# Patient Record
Sex: Female | Born: 1970 | Race: Black or African American | Hispanic: No | Smoking: Never smoker
Health system: Southern US, Community
[De-identification: ages and names within clinical notes are randomized; demographics above are authoritative.]

## PROBLEM LIST (undated history)

## (undated) DIAGNOSIS — H539 Unspecified visual disturbance: Secondary | ICD-10-CM

## (undated) HISTORY — DX: Unspecified visual disturbance: H53.9

## (undated) HISTORY — PX: CRANIECTOMY: SHX331

## (undated) HISTORY — PX: OTHER SURGICAL HISTORY: SHX169

---

## 1998-03-05 ENCOUNTER — Emergency Department (HOSPITAL_COMMUNITY): Admission: EM | Admit: 1998-03-05 | Discharge: 1998-03-05 | Payer: Self-pay | Admitting: Emergency Medicine

## 1998-05-05 ENCOUNTER — Other Ambulatory Visit: Admission: RE | Admit: 1998-05-05 | Discharge: 1998-05-05 | Payer: Self-pay | Admitting: Obstetrics

## 1998-05-08 ENCOUNTER — Other Ambulatory Visit: Admission: RE | Admit: 1998-05-08 | Discharge: 1998-05-08 | Payer: Self-pay | Admitting: Obstetrics

## 2002-11-17 ENCOUNTER — Encounter: Admission: RE | Admit: 2002-11-17 | Discharge: 2002-11-17 | Payer: Self-pay | Admitting: Family Medicine

## 2003-12-06 ENCOUNTER — Encounter: Admission: RE | Admit: 2003-12-06 | Discharge: 2003-12-06 | Payer: Self-pay | Admitting: Family Medicine

## 2003-12-06 ENCOUNTER — Encounter (INDEPENDENT_AMBULATORY_CARE_PROVIDER_SITE_OTHER): Payer: Self-pay | Admitting: *Deleted

## 2004-11-05 ENCOUNTER — Ambulatory Visit: Payer: Self-pay | Admitting: Family Medicine

## 2005-06-25 ENCOUNTER — Ambulatory Visit: Payer: Self-pay | Admitting: Sports Medicine

## 2005-10-16 ENCOUNTER — Ambulatory Visit: Payer: Self-pay | Admitting: Family Medicine

## 2005-10-16 ENCOUNTER — Ambulatory Visit (HOSPITAL_COMMUNITY): Admission: RE | Admit: 2005-10-16 | Discharge: 2005-10-16 | Payer: Self-pay | Admitting: Sports Medicine

## 2006-08-27 ENCOUNTER — Encounter (INDEPENDENT_AMBULATORY_CARE_PROVIDER_SITE_OTHER): Payer: Self-pay | Admitting: *Deleted

## 2006-09-15 ENCOUNTER — Ambulatory Visit: Payer: Self-pay | Admitting: Family Medicine

## 2006-12-18 DIAGNOSIS — R8789 Other abnormal findings in specimens from female genital organs: Secondary | ICD-10-CM | POA: Insufficient documentation

## 2006-12-18 DIAGNOSIS — F411 Generalized anxiety disorder: Secondary | ICD-10-CM | POA: Insufficient documentation

## 2006-12-19 ENCOUNTER — Encounter (INDEPENDENT_AMBULATORY_CARE_PROVIDER_SITE_OTHER): Payer: Self-pay | Admitting: *Deleted

## 2007-04-20 ENCOUNTER — Encounter: Payer: Self-pay | Admitting: *Deleted

## 2007-09-21 ENCOUNTER — Ambulatory Visit: Payer: Self-pay | Admitting: Family Medicine

## 2007-09-21 ENCOUNTER — Encounter: Payer: Self-pay | Admitting: Family Medicine

## 2007-09-21 LAB — CONVERTED CEMR LAB: Pap Smear: NORMAL

## 2007-12-14 ENCOUNTER — Emergency Department (HOSPITAL_COMMUNITY): Admission: EM | Admit: 2007-12-14 | Discharge: 2007-12-14 | Payer: Self-pay | Admitting: Emergency Medicine

## 2007-12-28 ENCOUNTER — Ambulatory Visit: Payer: Self-pay | Admitting: Family Medicine

## 2007-12-29 ENCOUNTER — Encounter: Payer: Self-pay | Admitting: *Deleted

## 2007-12-29 ENCOUNTER — Telehealth: Payer: Self-pay | Admitting: *Deleted

## 2008-04-25 ENCOUNTER — Telehealth: Payer: Self-pay | Admitting: Psychology

## 2008-07-25 ENCOUNTER — Ambulatory Visit: Payer: Self-pay | Admitting: Family Medicine

## 2008-12-09 ENCOUNTER — Encounter: Payer: Self-pay | Admitting: Family Medicine

## 2008-12-09 ENCOUNTER — Ambulatory Visit: Payer: Self-pay | Admitting: Family Medicine

## 2008-12-13 ENCOUNTER — Encounter: Payer: Self-pay | Admitting: Family Medicine

## 2009-04-25 ENCOUNTER — Telehealth: Payer: Self-pay | Admitting: Family Medicine

## 2009-04-28 ENCOUNTER — Encounter: Payer: Self-pay | Admitting: Family Medicine

## 2009-04-28 ENCOUNTER — Ambulatory Visit: Payer: Self-pay | Admitting: Family Medicine

## 2009-05-08 ENCOUNTER — Encounter: Payer: Self-pay | Admitting: Family Medicine

## 2009-05-22 ENCOUNTER — Telehealth: Payer: Self-pay | Admitting: Family Medicine

## 2009-06-23 ENCOUNTER — Ambulatory Visit: Payer: Self-pay | Admitting: Family Medicine

## 2009-06-23 ENCOUNTER — Encounter: Payer: Self-pay | Admitting: Family Medicine

## 2009-06-23 LAB — CONVERTED CEMR LAB
HDL: 57 mg/dL (ref >39)
Total CHOL/HDL Ratio: 3.2
Triglycerides: 47 mg/dL (ref <150)

## 2009-06-27 ENCOUNTER — Encounter: Payer: Self-pay | Admitting: Family Medicine

## 2010-08-30 ENCOUNTER — Encounter: Payer: Self-pay | Admitting: Family Medicine

## 2010-11-20 NOTE — Letter (Signed)
Summary: Out of Work  [facility]  [address]   [place], [place] [address]   Phone: [phone]  Fax: [phone]    December 29, 2007   Employee:  [employer]    To Whom It May Concern:   For Medical reasons, please excuse the above named employee from work for the following dates:  Start:   12-28-07  End:   12-29-07  If you need additional information, please feel free to contact our office.         Sincerely,    Cormick Moss CMA,

## 2010-11-20 NOTE — Miscellaneous (Signed)
  Clinical Lists Changes  Problems: Removed problem of SCREENING OTHER&UNSPEC CARDIOVASCULAR CONDITIONS (ICD-V81.2) Removed problem of DEPRESSION, SITUATIONAL, ACUTE (ICD-300.4) Removed problem of CONTACT OR EXPOSURE TO OTHER VIRAL DISEASES (ICD-V01.79) Removed problem of DISTURBANCE OF SKIN SENSATION (ICD-782.0) Removed problem of WEIGHT GAIN (ICD-783.1) Removed problem of DIZZINESS (ICD-780.4) Removed problem of SCREENING FOR LIPOID DISORDERS (ICD-V77.91) Removed problem of SCREENING FOR DIABETES MELLITUS (ICD-V77.1) Removed problem of ROUTINE GYNECOLOGICAL EXAMINATION (ICD-V72.31)

## 2010-11-20 NOTE — Progress Notes (Signed)
Summary: pls call  Phone Note Call from Patient Call back at Work Phone [phone] Call back at [phone]   Caller: Patient Summary of Call: pt is wanting to talk to [facility] about her FLMA - has wrong dates on the form. pls call  Initial call taken by: De Nurse,  May 22, 2009 2:20 PM  Follow-up for Phone Call        Left message that I need her to bring by form with clearly marked dates.  I saw her for the problem on 7/9 which is date I indicated for leave to begin and set for 30 days.   Follow-up by: Luretha Murphy NP,  May 22, 2009 3:36 PM

## 2010-11-20 NOTE — Assessment & Plan Note (Signed)
Summary: f/u possible panic attack at work/el  Medications Added NORTREL 7/7/7 0.5/0.75/1-35 MG-MCG TABS (NORETHIN-ETH ESTRAD TRIPHASIC) Take 1 tablet by mouth once a day ZOLOFT 50 MG  TABS (SERTRALINE HCL) 1/2 for daily for  1 week and then one daily.        Vital Signs:  Patient Profile:   40 Years Old Female Weight:      159 pounds Pulse rate:   65 / minute BP sitting:   106 / 69  (right arm)  Pt. in pain?   no  Vitals Entered By: Renato Battles slade,cma              Is Patient Diabetic? No     Chief Complaint:  ?anxiety. has been on zoloft years ago. dizziness and hyperventilating.Marland Kitchen  History of Present Illness: Pt to clinic s/p near syncopal episode last Monday.  Pt states she got up from her chair at work and made it to the mail room when she felt lightheaded and went down to the floor.  Reports she could hear everything around her but was unable to talk at the time-was transported to the ED and everything was found to be 'normal'.  States she has episodes of lightheadedness almost daily-room does not spin, just lightheaded.  Previous history of treatment with zoloft for anxiety.  States she did well with the medication years ago but stopped taking the med.  She reports in the past year a new marriage, moving, new job, and  small issues with teenage daughters.  Reports that she does worry about her daughters and has a good support system in her husband.  She is expresses concern about taking medication because she would like to be pregnant within the year.       Social History:    Married x  months.- currently having difficulty; Two daughters (41 and 73) Enjoys exercise; Support from church and husband.     Risk Factors:  Tobacco use:  never Exercise:  yes    Times per week:  3    Type:  treadmill   Review of Systems  CV      Denies chest pain or discomfort, palpitations, shortness of breath with exertion, swelling of feet, and swelling of hands.  Neuro      Denies  disturbances in coordination, falling down, headaches, sensation of room spinning, visual disturbances, and weakness.  Psych      See HPI     Impression & Recommendations:  Problem # 1:  DIZZINESS (ICD-780.4)  With previous work-up in ED (Negative labs and EKG) question underlying anxiety issues.  Patient had successful resolution of this type in the past with Zoloft.  Mother with anxiety disorder.  Missing meals and worried about weight.  Refer to Dr. Gerilyn Pilgrim for basic education. Orders: [facility]- Est Level  3 ([address])   Problem # 2:  ANXIETY (ICD-300.00) Anxiety.  Multiple life changes within the past year (positive).   Will try low dose zoloft and reevaluate in two weeks.  Pt to call clinic and report symptoms.   Her updated medication list for this problem includes:    Zoloft 50 Mg Tabs (Sertraline hcl) .Marland Kitchen... 1/2 for daily for  1 week and then one daily.   Complete Medication List: 1)  Nortrel 7/7/7 0.5/0.75/1-35 Mg-mcg Tabs (Norethin-eth estrad triphasic) .... Take 1 tablet by mouth once a day 2)  Zoloft 50 Mg Tabs (Sertraline hcl) .... 1/2 for daily for  1 week and then one daily.  Other  Orders: Nutrition Referral (Nutrition)   Patient Instructions: 1)  Eat regular meals.    Prescriptions: ZOLOFT 50 MG  TABS (SERTRALINE HCL) 1/2 for daily for  1 week and then one daily.  #30 x 6   Entered and Authorized by:   Luretha Murphy NP   Signed by:   Luretha Murphy NP on 12/28/2007   Method used:   Electronically sent to ...       [facility] [phone]*       [address]       [place], [place]  [address]       Ph: [phone]       Fax: [phone]   RxID:   [id number]  ]

## 2010-11-28 ENCOUNTER — Encounter: Payer: Self-pay | Admitting: *Deleted

## 2011-01-11 ENCOUNTER — Ambulatory Visit (INDEPENDENT_AMBULATORY_CARE_PROVIDER_SITE_OTHER): Payer: 59 | Admitting: Family Medicine

## 2011-01-11 ENCOUNTER — Other Ambulatory Visit (HOSPITAL_COMMUNITY)
Admission: RE | Admit: 2011-01-11 | Discharge: 2011-01-11 | Disposition: A | Discharge status: Home / Self Care | Payer: 59 | Source: Ambulatory Visit | Attending: Family Medicine | Admitting: Family Medicine

## 2011-01-11 ENCOUNTER — Encounter: Payer: Self-pay | Admitting: Family Medicine

## 2011-01-11 VITALS — BP 108/60 | HR 72 | Ht 64.0 in | Wt 158.0 lb

## 2011-01-11 DIAGNOSIS — Z01419 Encounter for gynecological examination (general) (routine) without abnormal findings: Secondary | ICD-10-CM | POA: Insufficient documentation

## 2011-01-11 DIAGNOSIS — Z131 Encounter for screening for diabetes mellitus: Secondary | ICD-10-CM

## 2011-01-11 DIAGNOSIS — Z13220 Encounter for screening for lipoid disorders: Secondary | ICD-10-CM

## 2011-01-11 DIAGNOSIS — Z124 Encounter for screening for malignant neoplasm of cervix: Secondary | ICD-10-CM

## 2011-01-11 LAB — LIPID PANEL
Cholesterol: 199 mg/dL (ref 0–200)
HDL: 67 mg/dL (ref >39)
LDL Cholesterol: 122 mg/dL — ABNORMAL HIGH (ref 0–99)
Total CHOL/HDL Ratio: 3 Ratio
Triglycerides: 49 mg/dL (ref <150)
VLDL: 10 mg/dL (ref 0–40)

## 2011-01-11 LAB — GLUCOSE, RANDOM: Glucose, Bld: 80 mg/dL (ref 70–99)

## 2011-01-11 NOTE — Progress Notes (Signed)
  Subjective:     JAIAH WEIGEL is a 40 y.o. female and is here for a comprehensive physical exam. The patient reports problems - occasional episodes of sweating, weakness and blurry vision, usually after not eating for a while.   Health Maintenance  Topic Date Due  . Pap Smear  01/01/1988  . Tetanus/tdap  01/20/2012      Review of Systems A comprehensive review of systems was negative except for: Constitutional: positive for sweats   Objective:    General appearance: cooperative and appears older than stated age Ears: normal TM's and external ear canals both ears Nose: Nares normal. Septum midline. Mucosa normal. No drainage or sinus tenderness. Throat: lips, mucosa, and tongue normal; teeth and gums normal Neck: no adenopathy, no carotid bruit, no JVD, supple, symmetrical, trachea midline and thyroid not enlarged, symmetric, no tenderness/mass/nodules Lungs: clear to auscultation bilaterally Breasts: normal appearance, no masses or tenderness Heart: regular rate and rhythm, S1, S2 normal, no murmur, click, rub or gallop Abdomen: soft, non-tender; bowel sounds normal; no masses,  no organomegaly Pelvic: cervix normal in appearance, external genitalia normal, no adnexal masses or tenderness, no cervical motion tenderness, rectovaginal septum normal, uterus normal size, shape, and consistency and vagina normal without discharge Extremities: extremities normal, atraumatic, no cyanosis or edema Pulses: 2+ and symmetric Skin: Skin color, texture, turgor normal. No rashes or lesions Lymph nodes: Cervical, supraclavicular, and axillary nodes normal.    Assessment:    Healthy female exam. With no active problems     Plan:     See After Visit Summary for Counseling Recommendations

## 2011-01-11 NOTE — Patient Instructions (Addendum)
  Place annual gynecologic exam patient instructions here. You will likely only need a PAP every 3 years if this is normal. Please call your insurance to find out when they pay for the first mammogram We will do blood work to check for diabetes and cholesterol For constipation I recommend generic Miralax (polyethaline glycol)

## 2011-01-14 ENCOUNTER — Encounter: Payer: Self-pay | Admitting: Family Medicine

## 2011-01-15 ENCOUNTER — Encounter: Payer: Self-pay | Admitting: Family Medicine

## 2011-07-15 LAB — CBC
MCHC: 33.4
Platelets: 337
RDW: 13.4

## 2011-07-15 LAB — URINE MICROSCOPIC-ADD ON

## 2011-07-15 LAB — I-STAT 8, (EC8 V) (CONVERTED LAB)
Acid-base deficit: 1
Bicarbonate: 25.2 — ABNORMAL HIGH
HCT: 42
Operator id: 146091
pCO2, Ven: 46.1
pH, Ven: 7.345 — ABNORMAL HIGH

## 2011-07-15 LAB — POCT CARDIAC MARKERS: Troponin i, poc: <0.05

## 2011-07-15 LAB — POCT PREGNANCY, URINE
Operator id: 146091
Preg Test, Ur: NEGATIVE

## 2011-07-15 LAB — POCT I-STAT CREATININE
Creatinine, Ser: 1
Operator id: 146091

## 2011-07-15 LAB — URINALYSIS, ROUTINE W REFLEX MICROSCOPIC
Glucose, UA: NEGATIVE
Ketones, ur: NEGATIVE
Leukocytes, UA: NEGATIVE
Nitrite: NEGATIVE
Protein, ur: NEGATIVE
pH: 7.5

## 2011-10-09 ENCOUNTER — Emergency Department (INDEPENDENT_AMBULATORY_CARE_PROVIDER_SITE_OTHER)
Admission: EM | Admit: 2011-10-09 | Discharge: 2011-10-09 | Disposition: A | Discharge status: Home / Self Care | Payer: Self-pay | Source: Home / Self Care | Attending: Family Medicine | Admitting: Family Medicine

## 2011-10-09 ENCOUNTER — Encounter (HOSPITAL_COMMUNITY): Payer: Self-pay | Admitting: Emergency Medicine

## 2011-10-09 DIAGNOSIS — R05 Cough: Secondary | ICD-10-CM

## 2011-10-09 MED ORDER — GUAIFENESIN-CODEINE 100-10 MG/5ML PO SYRP: 5.0000 mL | ORAL_SOLUTION | Freq: Four times a day (QID) | ORAL | Status: AC | PRN

## 2011-10-09 NOTE — ED Provider Notes (Signed)
History     CSN: [id number] Arrival date & time: 10/09/2011  7:28 PM   First MD Initiated Contact with Patient 10/09/11 1838      Chief Complaint  Patient presents with  . Sinusitis    (Consider location/radiation/quality/duration/timing/severity/associated sxs/prior treatment) HPI Comments: Joy Gentry presents for evaluation of persistent non-productive cough since Thanksgiving. She reports having a URI at that time that has since resolved with a residual dry cough. She denies any fever or other symptoms at this time.   Patient is a 40 y.o. female presenting with sinusitis. The history is provided by the patient.  Sinusitis  This is a recurrent problem. The current episode started more than 1 week ago. The problem has not changed since onset.There has been no fever. Associated symptoms include congestion and cough. Pertinent negatives include no chills, no ear pain, no sinus pressure, no sore throat and no shortness of breath. She has tried other medications for the symptoms. The treatment provided no relief.    History reviewed. No pertinent past medical history.  History reviewed. No pertinent past surgical history.  History reviewed. No pertinent family history.  History  Substance Use Topics  . Smoking status: Never Smoker   . Smokeless tobacco: Not on file  . Alcohol Use: Not on file    OB History    Grav Para Term Preterm Abortions TAB SAB Ect Mult Living                  Review of Systems  Constitutional: Negative for fever and chills.  HENT: Positive for congestion. Negative for ear pain, sore throat, rhinorrhea, sneezing, trouble swallowing and sinus pressure.   Eyes: Negative.   Respiratory: Positive for cough. Negative for shortness of breath and wheezing.   Cardiovascular: Negative.   Gastrointestinal: Negative.   Genitourinary: Negative.   Musculoskeletal: Negative.   Skin: Negative.   Neurological: Negative.     Allergies  Review of patient's  allergies indicates no known allergies.  Home Medications   Current Outpatient Rx  Name Route Sig Dispense Refill  . GUAIFENESIN-CODEINE 100-10 MG/5ML PO SYRP Oral Take 5 mLs by mouth every 6 (six) hours as needed for cough or congestion. 120 mL 0    BP 106/69  Pulse 60  Temp(Src) 98.6 F (37 C) (Oral)  Resp 15  SpO2 100%  LMP 10/06/2011  Physical Exam  Nursing note and vitals reviewed. Constitutional: She is oriented to person, place, and time. She appears well-developed and well-nourished.  HENT:  Head: Normocephalic and atraumatic.  Eyes: EOM are normal.  Neck: Normal range of motion.  Cardiovascular: Normal rate and regular rhythm.   Pulmonary/Chest: Effort normal and breath sounds normal. She has no wheezes.  Musculoskeletal: Normal range of motion.  Neurological: She is alert and oriented to person, place, and time.  Skin: Skin is warm and dry.  Psychiatric: Her behavior is normal.    ED Course  Procedures (including critical care time)  Labs Reviewed - No data to display No results found.   1. Post-viral cough syndrome        Rx given for guaifenesin AC.        Richardo Priest, MD 10/09/11 2020

## 2011-10-09 NOTE — ED Notes (Signed)
Pt been sick since Thanksgiving, took OTC medicines, got better but not completely and is getting worse again. Cough, congestion, aches a week ago. Biggest complaint is the cough.

## 2012-12-01 ENCOUNTER — Ambulatory Visit (INDEPENDENT_AMBULATORY_CARE_PROVIDER_SITE_OTHER): Payer: BC Managed Care – PPO | Admitting: Family Medicine

## 2012-12-01 ENCOUNTER — Other Ambulatory Visit: Payer: Self-pay | Admitting: Family Medicine

## 2012-12-01 ENCOUNTER — Encounter: Payer: Self-pay | Admitting: Family Medicine

## 2012-12-01 VITALS — BP 118/71 | HR 80 | Ht 64.0 in | Wt 154.0 lb

## 2012-12-01 DIAGNOSIS — Z0000 Encounter for general adult medical examination without abnormal findings: Secondary | ICD-10-CM

## 2012-12-01 DIAGNOSIS — Z01419 Encounter for gynecological examination (general) (routine) without abnormal findings: Secondary | ICD-10-CM

## 2012-12-01 DIAGNOSIS — Z1231 Encounter for screening mammogram for malignant neoplasm of breast: Secondary | ICD-10-CM

## 2012-12-01 DIAGNOSIS — Z23 Encounter for immunization: Secondary | ICD-10-CM

## 2012-12-01 NOTE — Patient Instructions (Signed)
It was nice to meet you.  Your blood pressure today was BP: 118/71 mmHg, which is perfect.   You can read about using ovulation strips online if you are intrested in using that to help you get pregnant.   Please continue to get regular exercise, start taking a prenatal vitamin, and continue to avoid tobacco, alcohol and drugs.  Joy Gentry!

## 2012-12-02 ENCOUNTER — Encounter: Payer: Self-pay | Admitting: Family Medicine

## 2012-12-02 DIAGNOSIS — Z01419 Encounter for gynecological examination (general) (routine) without abnormal findings: Secondary | ICD-10-CM | POA: Insufficient documentation

## 2012-12-02 NOTE — Progress Notes (Signed)
  Subjective:    Patient ID: Joy Gentry, female    DOB: Nov 08, 1970, 42 y.o.   MRN: [id number]  HPI:  Patient comes in for well woman exam.  She has no complaints, but has some questions about getting pregnant.  She says she and her ex-husband are getting re-married and want to have a baby.  She says she has had a few irregular periods in the past year, so she was worried about her fertility.  She says her mother went through menopause in her late 29's. She says her weight has been stable over the past several years, and she gets regular exercise 3-4 days a week.   Health maintenance: Had normal mammogram 2 years ago, is scheduled for another one in March. Had normal Pap in 2012.  Says she does not like to get the flu shot, declines one today Is overdue for a TDap, is agreeable to getting one today.  History reviewed. No pertinent past medical history.  History  Substance Use Topics  . Smoking status: Never Smoker   . Smokeless tobacco: Never Used  . Alcohol Use: No    History reviewed. No pertinent family history.   ROS Pertinent items in HPI    Objective:  Physical Exam:  BP 118/71  Pulse 80  Ht 5\' 4"  (1.626 m)  Wt 154 lb (69.854 kg)  BMI 26.42 kg/m2 General appearance: alert, cooperative and no distress Head: Normocephalic, without obvious abnormality, atraumatic Lungs: clear to auscultation bilaterally Heart: regular rate and rhythm, S1, S2 normal, no murmur, click, rub or gallop Pulses: 2+ and symmetric     Assessment & Plan:

## 2012-12-02 NOTE — Assessment & Plan Note (Signed)
Healthy non-smoker, slightly elevated BMI at 26, exercises regularly. Discussed that I feel she healthy and should continue her active lifestyle.  I do not think she needs any major changes before trying to get pregnant, but did suggest switching from regular MVI to a prenatal.  I advised she can use ovulation urine strips to help get pregnant if needed, but would not recommend any labs/studies for infertility until she has tried to get pregnant for 6 mo-1 year.

## 2012-12-29 ENCOUNTER — Ambulatory Visit
Admission: RE | Admit: 2012-12-29 | Discharge: 2012-12-29 | Disposition: A | Discharge status: Home / Self Care | Payer: BC Managed Care – PPO | Source: Ambulatory Visit | Attending: Family Medicine | Admitting: Family Medicine

## 2012-12-29 DIAGNOSIS — Z1231 Encounter for screening mammogram for malignant neoplasm of breast: Secondary | ICD-10-CM

## 2013-01-07 ENCOUNTER — Encounter (HOSPITAL_COMMUNITY): Payer: Self-pay | Admitting: Emergency Medicine

## 2013-01-07 ENCOUNTER — Emergency Department (HOSPITAL_COMMUNITY)
Admission: EM | Admit: 2013-01-07 | Discharge: 2013-01-07 | Disposition: A | Discharge status: Home / Self Care | Payer: BC Managed Care – PPO | Attending: Emergency Medicine | Admitting: Emergency Medicine

## 2013-01-07 DIAGNOSIS — R109 Unspecified abdominal pain: Secondary | ICD-10-CM | POA: Insufficient documentation

## 2013-01-07 DIAGNOSIS — Y929 Unspecified place or not applicable: Secondary | ICD-10-CM | POA: Insufficient documentation

## 2013-01-07 DIAGNOSIS — Y9389 Activity, other specified: Secondary | ICD-10-CM | POA: Insufficient documentation

## 2013-01-07 DIAGNOSIS — IMO0002 Reserved for concepts with insufficient information to code with codable children: Secondary | ICD-10-CM | POA: Insufficient documentation

## 2013-01-07 DIAGNOSIS — T192XXA Foreign body in vulva and vagina, initial encounter: Secondary | ICD-10-CM | POA: Insufficient documentation

## 2013-01-07 MED ORDER — CLINDAMYCIN HCL 150 MG PO CAPS
150.0000 mg | ORAL_CAPSULE | Freq: Once | ORAL | Status: AC
  Administered 2013-01-07: 150 mg via ORAL
  Filled 2013-01-07: qty 1

## 2013-01-07 MED ORDER — CLINDAMYCIN HCL 150 MG PO CAPS: 150.0000 mg | ORAL_CAPSULE | Freq: Three times a day (TID) | ORAL | Status: DC

## 2013-01-07 NOTE — ED Provider Notes (Signed)
History     CSN: [id number]  Arrival date & time 01/07/13  0449   First MD Initiated Contact with Patient 01/07/13 0501      Chief Complaint  Patient presents with  . Foreign Body in Vagina    (Consider location/radiation/quality/duration/timing/severity/associated sxs/prior treatment) HPI Comments: Pateint states she forgot to remove a tampon on Sunday had intercourse with her husband and now can not remove it   + odor but no discharge, slight abdominal discomfort Denies dysuria, fever. myalgias  Patient is a 42 y.o. female presenting with foreign body in vagina. The history is provided by the patient.  Foreign Body in Vagina This is a new problem. The current episode started in the past 7 days. The problem occurs constantly. The problem has been gradually worsening. Associated symptoms include abdominal pain. Pertinent negatives include no urinary symptoms or vomiting. Nothing aggravates the symptoms. She has tried nothing for the symptoms. The treatment provided no relief.    History reviewed. No pertinent past medical history.  Past Surgical History  Procedure Laterality Date  . Oophrectomy Left   . Neck sugery      No family history on file.  History  Substance Use Topics  . Smoking status: Never Smoker   . Smokeless tobacco: Never Used  . Alcohol Use: No    OB History   Grav Para Term Preterm Abortions TAB SAB Ect Mult Living                  Review of Systems  Gastrointestinal: Positive for abdominal pain. Negative for vomiting.  Genitourinary: Positive for vaginal pain. Negative for dysuria and vaginal discharge.  All other systems reviewed and are negative.    Allergies  Review of patient's allergies indicates no known allergies.  Home Medications   Current Outpatient Rx  Name  Route  Sig  Dispense  Refill  . calcium carbonate 1250 MG capsule   Oral   Take 1,250 mg by mouth 2 (two) times daily with a meal.         . Multiple Vitamin  (MULTIVITAMIN WITH MINERALS) TABS   Oral   Take 1 tablet by mouth daily.         . vitamin B-12 (CYANOCOBALAMIN) 100 MCG tablet   Oral   Take 50 mcg by mouth daily.         . clindamycin (CLEOCIN) 150 MG capsule   Oral   Take 1 capsule (150 mg total) by mouth 3 (three) times daily.   20 capsule   0     BP 111/63  Pulse 65  Temp(Src) 98.2 F (36.8 C) (Oral)  Resp 16  SpO2 100%  Physical Exam  Constitutional: She appears well-developed and well-nourished.  HENT:  Head: Normocephalic.  Eyes: Pupils are equal, round, and reactive to light.  Neck: Normal range of motion.  Cardiovascular: Normal rate and regular rhythm.   Pulmonary/Chest: Effort normal.  Abdominal: Soft. She exhibits no distension. There is no tenderness.  Genitourinary: Vagina normal. No vaginal discharge found.  Retained tampon removed no vaginal tenderness/discharge  Musculoskeletal: Normal range of motion.  Neurological: She is alert.  Skin: Skin is warm and dry. No erythema. No pallor.    ED Course  Procedures (including critical care time)  Labs Reviewed - No data to display No results found.   1. Vaginal foreign body, initial encounter       MDM   Tampon removed         Dondra Spry  Wynona Luna, NP 01/07/13 8119

## 2013-01-07 NOTE — ED Provider Notes (Signed)
Medical screening examination/treatment/procedure(s) were performed by non-physician practitioner and as supervising physician I was immediately available for consultation/collaboration.  John-Adam Tommey Barret, M.D.  John-Adam Shaniqwa Horsman, MD 01/07/13 0619 

## 2013-01-07 NOTE — ED Notes (Signed)
Pt. States that she put a tampon inside her vagina 4 days ago and forgot to remove it. She states she can feel something in there but is unable to remove it herself. She C/O discomfort  in her lower abdomen and a fowl odor. Denies chills, fever and body aches.

## 2013-06-30 ENCOUNTER — Emergency Department (HOSPITAL_COMMUNITY): Payer: No Typology Code available for payment source

## 2013-06-30 ENCOUNTER — Emergency Department (HOSPITAL_COMMUNITY)
Admission: EM | Admit: 2013-06-30 | Discharge: 2013-06-30 | Disposition: A | Discharge status: Home / Self Care | Payer: No Typology Code available for payment source | Attending: Emergency Medicine | Admitting: Emergency Medicine

## 2013-06-30 ENCOUNTER — Encounter (HOSPITAL_COMMUNITY): Payer: Self-pay

## 2013-06-30 DIAGNOSIS — M545 Low back pain, unspecified: Secondary | ICD-10-CM | POA: Insufficient documentation

## 2013-06-30 DIAGNOSIS — Z79899 Other long term (current) drug therapy: Secondary | ICD-10-CM | POA: Insufficient documentation

## 2013-06-30 DIAGNOSIS — G8911 Acute pain due to trauma: Secondary | ICD-10-CM | POA: Insufficient documentation

## 2013-06-30 DIAGNOSIS — M25519 Pain in unspecified shoulder: Secondary | ICD-10-CM | POA: Insufficient documentation

## 2013-06-30 DIAGNOSIS — Z9889 Other specified postprocedural states: Secondary | ICD-10-CM | POA: Insufficient documentation

## 2013-06-30 DIAGNOSIS — M549 Dorsalgia, unspecified: Secondary | ICD-10-CM

## 2013-06-30 NOTE — ED Notes (Signed)
Patient requests CD of imaging. Radiology notified of request and will bring it over once its completed

## 2013-06-30 NOTE — ED Notes (Signed)
Patient transported to X-ray 
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
## 2013-07-01 ENCOUNTER — Ambulatory Visit: Payer: BC Managed Care – PPO | Admitting: Family Medicine

## 2013-07-02 NOTE — ED Provider Notes (Signed)
Medical screening examination/treatment/procedure(s) were performed by non-physician practitioner and as supervising physician I was immediately available for consultation/collaboration.    Vida Roller, MD 07/02/13 [phone]

## 2013-07-21 ENCOUNTER — Encounter: Payer: Self-pay | Admitting: Family Medicine

## 2013-07-21 ENCOUNTER — Ambulatory Visit (INDEPENDENT_AMBULATORY_CARE_PROVIDER_SITE_OTHER): Payer: BC Managed Care – PPO | Admitting: Family Medicine

## 2013-07-21 VITALS — BP 115/71 | HR 84 | Temp 98.4°F | Wt 164.0 lb

## 2013-07-21 DIAGNOSIS — N938 Other specified abnormal uterine and vaginal bleeding: Secondary | ICD-10-CM

## 2013-07-21 DIAGNOSIS — N949 Unspecified condition associated with female genital organs and menstrual cycle: Secondary | ICD-10-CM

## 2013-07-21 LAB — POCT URINE PREGNANCY: Preg Test, Ur: NEGATIVE

## 2013-07-21 NOTE — Progress Notes (Signed)
Patient ID: Joy Gentry, female   DOB: 03-Apr-1971, 42 y.o.   MRN: [id number]  [facility] Lovel Suazo M. Chistian Kasler, MD Phone: [phone]   Subjective: HPI: Patient is a 42 y.o. female presenting to clinic today for excessive vaginal bleeding.  3 months ago, had 3 periods in one month. (Normal flow x 5 days). Regular in July and August with one period. Then in September had 3 periods again. 3rd period was lighter, then she had pain in RLQ one week after bleeding. (Improved with Aleve.) Typically periods are every 28 days or so, non-painful.   When she was 17, she had increased bleeding with left lower and was diagnosed with ovarian cyst (size of a baseball.)   History Reviewed: Never smoker. Health Maintenance: Declined flu shot  ROS: Please see HPI above.  Objective: Office vital signs reviewed. BP 115/71  Pulse 84  Temp(Src) 98.4 F (36.9 C) (Oral)  Wt 164 lb (74.39 kg)  BMI 28.14 kg/m2  LMP 06/25/2013  Physical Examination:  General: Awake, alert. NAD HEENT: Atraumatic, normocephalic Neck: No masses palpated. No LAD Pulm: CTAB, no wheezes Cardio: RRR, no murmurs appreciated Abdomen:+BS, soft, nontender, nondistended GU: deferred Extremities: No edema Neuro: Grossly intact  Assessment: 42 y.o. female with DUB  Plan: See Problem List and After Visit Summary

## 2013-07-21 NOTE — Patient Instructions (Addendum)
We will check an ultrasound to see if anything abnormal shows up. If you continue to have bleeding, I can order a medication that should not interfere with your ability to get pregnant.  Please come back if this persists for a pelvic exam.  Kensly Bowmer M. Amit Meloy, M.D.

## 2013-07-22 DIAGNOSIS — N938 Other specified abnormal uterine and vaginal bleeding: Secondary | ICD-10-CM | POA: Insufficient documentation

## 2013-07-22 NOTE — Assessment & Plan Note (Signed)
Urine preg negative. Since she has history of large ovarian cyst and is having difficulty conceiving, will refer for pelvic ultrasound. Monitor her bleeding patterns for now. Will consider Provera if bleeding increased or becomes more frequent. Follow up prn.

## 2013-07-26 ENCOUNTER — Other Ambulatory Visit: Payer: Self-pay | Admitting: Family Medicine

## 2013-07-26 ENCOUNTER — Ambulatory Visit (HOSPITAL_COMMUNITY)
Admission: RE | Admit: 2013-07-26 | Discharge: 2013-07-26 | Disposition: A | Discharge status: Home / Self Care | Payer: BC Managed Care – PPO | Source: Ambulatory Visit | Attending: Family Medicine | Admitting: Family Medicine

## 2013-07-26 DIAGNOSIS — N938 Other specified abnormal uterine and vaginal bleeding: Secondary | ICD-10-CM | POA: Insufficient documentation

## 2013-07-26 DIAGNOSIS — N949 Unspecified condition associated with female genital organs and menstrual cycle: Secondary | ICD-10-CM | POA: Insufficient documentation

## 2013-07-26 DIAGNOSIS — N854 Malposition of uterus: Secondary | ICD-10-CM | POA: Insufficient documentation

## 2013-08-02 ENCOUNTER — Telehealth: Payer: Self-pay | Admitting: Family Medicine

## 2013-08-02 NOTE — Telephone Encounter (Signed)
Pt called and would like to know the results of her xray's that were done at [facility]. She said that if she doesn't answer we can leave detailed message on her voicemail. JW

## 2013-08-02 NOTE — Telephone Encounter (Signed)
Will forward to MD. Nalany Steedley,CMA  

## 2014-01-26 ENCOUNTER — Encounter (HOSPITAL_COMMUNITY): Payer: Self-pay | Admitting: Emergency Medicine

## 2014-01-26 ENCOUNTER — Emergency Department (INDEPENDENT_AMBULATORY_CARE_PROVIDER_SITE_OTHER)
Admission: EM | Admit: 2014-01-26 | Discharge: 2014-01-26 | Disposition: A | Discharge status: Home / Self Care | Payer: Self-pay | Source: Home / Self Care | Attending: Emergency Medicine | Admitting: Emergency Medicine

## 2014-01-26 DIAGNOSIS — J069 Acute upper respiratory infection, unspecified: Secondary | ICD-10-CM

## 2014-01-26 LAB — POCT RAPID STREP A: STREPTOCOCCUS, GROUP A SCREEN (DIRECT): NEGATIVE

## 2014-01-26 MED ORDER — AZITHROMYCIN 250 MG PO TABS: ORAL_TABLET | ORAL | Status: DC

## 2014-01-26 MED ORDER — GUAIFENESIN-CODEINE 100-10 MG/5ML PO SYRP: 10.0000 mL | ORAL_SOLUTION | Freq: Four times a day (QID) | ORAL | Status: DC | PRN

## 2014-01-26 NOTE — ED Notes (Signed)
C/o cough,congestion pain in ears,ST since 4-4

## 2014-01-26 NOTE — Discharge Instructions (Signed)
Do not get antibiotic filled unless no better in 2 to 3 days. ° ° °Most upper respiratory infections are caused by viruses and do not require antibiotics.  We try to save the antibiotics for when we really need them to prevent bacteria from developing resistance to them.  Here are a few hints about things that can be done at home to help get over an upper respiratory infection quicker: ° °Get extra sleep and extra fluids.  Get 7 to 9 hours of sleep per night and 6 to 8 glasses of water a day.  Getting extra sleep keeps the immune system from getting run down.  Most people with an upper respiratory infection are a little dehydrated.  The extra fluids also keep the secretions liquified and easier to deal with.  Also, get extra vitamin C.  4000 mg per day is the recommended dose. °For the aches, headache, and fever, acetaminophen or ibuprofen are helpful.  These can be alternated every 4 hours.  People with liver disease should avoid large amounts of acetaminophen, and people with ulcer disease, gastroesophageal reflux, gastritis, congestive heart failure, chronic kidney disease, coronary artery disease and the elderly should avoid ibuprofen. °For nasal congestion try Mucinex-D, or if you're having lots of sneezing or clear nasal drainage use Zyrtec-D. People with high blood pressure can take these if their blood pressure is controlled, if not, it's best to avoid the forms with a "D" (decongestants).  You can use the plain Mucinex, Allegra, Claritin, or Zyrtec even if your blood pressure is not controlled.   °A Saline nasal spray such as Ocean Spray can also help.  You can add a decongestant sprays such as Afrin, but you should not use the decongestant sprays for more than 3 or 4 days since they can be habituating.  Breathe Rite nasal strips can also offer a non-drug alternative treatment to nasal congestion, especially at night. °For people with symptoms of sinusitis, sleeping with your head elevated can be helpful.   For sinus pain, moist, hot compresses to the face may provide some relief.  Many people find that inhaling steam as in a shower or from a pot of steaming water can help. °For any viral infection, zinc containing lozenges such as Cold-Eze or Zicam are helpful.  Zinc helps to fight viral infection.  Hot salt water gargles (8 oz of hot water, 1/2 tsp of table salt, and a pinch of baking soda) can give relief as well as hot beverages such as hot tea.  Sucrets extra strength lozenges will help the sore throat.  °For the cough, take Delsym 2 tsp every 12 hours.  It has also been found recently that Aleve can help control a cough.  The dose is 1 to 2 tablets twice daily with food.  This can be combined with Delsym. (Note, if you are taking ibuprofen, you should not take Aleve as well--take one or the other.) °A cool mist vaporizer will help keep your mucous membranes from drying out.  ° °It's important when you have an upper respiratory infection not to pass the infection to others.  This involves being very careful about the following: ° °Frequent hand washing or use of hand sanitizer, especially after coughing, sneezing, blowing your nose or touching your face, nose or eyes. °Do not shake hands or touch anyone and try to avoid touching surfaces that other people use such as doorknobs, shopping carts, telephones and computer keyboards. °Use tissues and dispose of them properly in a   garbage can or ziplock bag. °Cough into your sleeve. °Do not let others eat or drink after you. ° °It's also important to recognize the signs of serious illness and get evaluated if they occur: °Any respiratory infection that lasts more than 7 to 10 days.  Yellow nasal drainage and sputum are not reliable indicators of a bacterial infection, but if they last for more than 1 week, see your doctor. °Fever and sore throat can indicate strep. °Fever and cough can indicate influenza or pneumonia. °Any kind of severe symptom such as difficulty  breathing, intractable vomiting, or severe pain should prompt you to see a doctor as soon as possible. ° ° °Your body's immune system is really the thing that will get rid of this infection.  Your immune system is comprised of 2 types of specialized cells called T cells and B cells.  T cells coordinate the array of cells in your body that engulf invading bacteria or viruses while B cells orchestrate the production of antibodies that neutralize infection.  Anything we do or any medications we give you, will just strengthen your immune system or help it clear up the infection quicker.  Here are a few helpful hints to improve your immune system to help overcome this illness or to prevent future infections: °· A few vitamins can improve the health of your immune system.  That's why your diet should include plenty of fruits, vegetables, fish, nuts, and whole grains. °· Vitamin A and bet-carotene can increase the cells that fight infections (T cells and B cells).  Vitamin A is abundant in dark greens and orange vegetables such as spinach, greens, sweet potatoes, and carrots. °· Vitamin B6 contributes to the maturation of white blood cells, the cells that fight disease.  Foods with vitamin B6 include cold cereal and bananas. °· Vitamin C is credited with preventing colds because it increases white blood cells and also prevents cellular damage.  Citrus fruits, peaches and green and red bell peppers are all hight in vitamin C. °· Vitamin E is an anti-oxidant that encourages the production of natural killer cells which reject foreign invaders and B cells that produce antibodies.  Foods high in vitamin E include wheat germ, nuts and seeds. °· Foods high in omega-3 fatty acids found in foods like salmon, tuna and mackerel boost your immune system and help cells to engulf and absorb germs. °· Probiotics are good bacteria that increase your T cells.  These can be found in yogurt and are available in supplements such as Culturelle  or Align. °· Moderate exercise increases the strength of your immune system and your ability to recover from illness.  I suggest 3 to 5 moderate intensity 30 minute workouts per week.   °· Sleep is another component of maintaining a strong immune system.  It enables your body to recuperate from the day's activities, stress and work.  My recommendation is to get between 7 and 9 hours of sleep per night. °· If you smoke, try to quit completely or at least cut down.  Drink alcohol only in moderation if at all.  No more than 2 drinks daily for men or 1 for women. °· Get a flu vaccine early in the fall or if you have not gotten one yet, once this illness has run its course.  If you are over 65, a smoker, or an asthmatic, get a pneumococcal vaccine. °· My final recommendation is to maintain a healthy weight.  Excess weight can impair the   immune system by interfering with the way the immune system deals with invading viruses or bacteria. ° ° °

## 2014-01-26 NOTE — ED Provider Notes (Signed)
  Chief Complaint   Chief Complaint  Patient presents with  . URI    History of Present Illness   Joy Gentry is a 43 year old female who has had a five-day history of sore throat, ear congestion, dizziness, cough productive yellow sputum, burning in her chest, nasal congestion with clear drainage, subjective fever, and chills. She denies any GI symptoms. She denies any obvious sick exposure.  Review of Systems   Other than as noted above, the patient denies any of the following symptoms: Systemic:  No fevers, chills, sweats, or myalgias. Eye:  No redness or discharge. ENT:  No ear pain, headache, nasal congestion, drainage, sinus pressure, or sore throat. Neck:  No neck pain, stiffness, or swollen glands. Lungs:  No cough, sputum production, hemoptysis, wheezing, chest tightness, shortness of breath or chest pain. GI:  No abdominal pain, nausea, vomiting or diarrhea.  PMFSH   Past medical history, family history, social history, meds, and allergies were reviewed.   Physical exam   Vital signs:  BP 110/66  Pulse 86  Temp(Src) 98.8 F (37.1 C) (Oral)  Resp 14  SpO2 99% General:  Alert and oriented.  In no distress.  Skin warm and dry. Eye:  No conjunctival injection or drainage. Lids were normal. ENT:  TMs and canals were normal, without erythema or inflammation.  Nasal mucosa was clear and uncongested, without drainage.  Mucous membranes were moist.  Pharynx was clear with no exudate or drainage.  There were no oral ulcerations or lesions. Neck:  Supple, no adenopathy, tenderness or mass. Lungs:  No respiratory distress.  Lungs were clear to auscultation, without wheezes, rales or rhonchi.  Breath sounds were clear and equal bilaterally.  Heart:  Regular rhythm, without gallops, murmers or rubs. Skin:  Clear, warm, and dry, without rash or lesions.  Labs   Results for orders placed during the hospital encounter of 01/26/14  POCT RAPID STREP A ([facility]RG CARE ONLY)      Result Value Ref Range   Streptococcus, Group A Screen (Direct) NEGATIVE  NEGATIVE    Assessment     The encounter diagnosis was Viral upper respiratory infection.  Plan    1.  Meds:  The following meds were prescribed:   Discharge Medication List as of 01/26/2014  8:55 AM    START taking these medications   Details  azithromycin (ZITHROMAX Z-PAK) 250 MG tablet Take as directed., Print    guaiFENesin-codeine (GUIATUSS AC) 100-10 MG/5ML syrup Take 10 mLs by mouth 4 (four) times daily as needed for cough., Starting 01/26/2014, Until Discontinued, Print       The patient was told not to get the prescription for antibiotic filled unless her respiratory symptoms had persisted for more than 7 to 10 days.  2.  Patient Education/Counseling:  The patient was given appropriate handouts, self care instructions, and instructed in symptomatic relief.  Instructed to get extra fluids, rest, and use a cool mist vaporizer.    3.  Follow up:  The patient was told to follow up here if no better in 3 to 4 days, or sooner if becoming worse in any way, and given some red flag symptoms such as increasing fever, difficulty breathing, chest pain, or persistent vomiting which would prompt immediate return.  Follow up here as needed.      Reuben Likesavid C Dontell Mian, MD 01/26/14 [phone]

## 2014-01-27 LAB — CULTURE, GROUP A STREP

## 2014-02-24 ENCOUNTER — Telehealth (HOSPITAL_COMMUNITY): Payer: Self-pay | Admitting: *Deleted

## 2014-02-24 NOTE — ED Notes (Signed)
Pt. called on VM and said she lost the Rx. of Z-pack.  She is asking for it to be called in. Pt. called back @1555 , c/o persistant cough.  I told her it has been 1 month since she was seen and doubt we will do that, but told her I would ask.  Chart printed and shown to Borders Groupach Baker PA.  He said if it is just the persistant cough from bronchitis, Zithromax won't help.  If she is worried, she can come back and be seen again.  I told pt. this. Joy Gentry 02/24/2014

## 2014-10-17 ENCOUNTER — Emergency Department (INDEPENDENT_AMBULATORY_CARE_PROVIDER_SITE_OTHER)
Admission: EM | Admit: 2014-10-17 | Discharge: 2014-10-17 | Disposition: A | Discharge status: Home / Self Care | Payer: Self-pay | Source: Home / Self Care | Attending: Family Medicine | Admitting: Family Medicine

## 2014-10-17 ENCOUNTER — Encounter (HOSPITAL_COMMUNITY): Payer: Self-pay | Admitting: Emergency Medicine

## 2014-10-17 DIAGNOSIS — S7011XA Contusion of right thigh, initial encounter: Secondary | ICD-10-CM

## 2014-10-17 NOTE — ED Provider Notes (Signed)
CSN: [id number]     Arrival date & time 10/17/14  1627 History   First MD Initiated Contact with Patient 10/17/14 1728     Chief Complaint  Patient presents with  . Fall  . Bleeding/Bruising   (Consider location/radiation/quality/duration/timing/severity/associated sxs/prior Treatment) Patient is a 43 y.o. female presenting with leg pain. The history is provided by the patient.  Leg Pain Location:  Buttock Time since incident:  3 days Injury: yes   Mechanism of injury: fall   Mechanism of injury comment:  At home and fell onto hard plastic crate in room wirh lg bruise to right post thigh. Fall:    Fall occurred:  From a bed   Entrapped after fall: no   Buttock location:  R buttock Pain details:    Severity:  Mild   Onset quality:  Sudden   Progression:  Unchanged Dislocation: no   Tetanus status:  Up to date Prior injury to area:  No Relieved by:  None tried Worsened by:  Nothing tried Ineffective treatments:  None tried Associated symptoms: no decreased ROM, no muscle weakness, no numbness, no stiffness and no swelling     History reviewed. No pertinent past medical history. Past Surgical History  Procedure Laterality Date  . Oophrectomy Left   . Neck sugery     History reviewed. No pertinent family history. History  Substance Use Topics  . Smoking status: Never Smoker   . Smokeless tobacco: Never Used  . Alcohol Use: No   OB History    No data available     Review of Systems  Constitutional: Negative.   Musculoskeletal: Negative for myalgias, joint swelling, arthralgias, gait problem and stiffness.  Skin: Positive for wound.    Allergies  Review of patient's allergies indicates no known allergies.  Home Medications   Prior to Admission medications   Medication Sig Start Date End Date Taking? Authorizing Provider  azithromycin (ZITHROMAX Z-PAK) 250 MG tablet Take as directed. 01/26/14   Reuben Likesavid C Keller, MD  clindamycin (CLEOCIN) 150 MG capsule Take 1  capsule (150 mg total) by mouth 3 (three) times daily. 01/07/13   Arman FilterGail K Schulz, NP  guaiFENesin-codeine (GUIATUSS AC) 100-10 MG/5ML syrup Take 10 mLs by mouth 4 (four) times daily as needed for cough. 01/26/14   Reuben Likesavid C Keller, MD  Multiple Vitamin (MULTIVITAMIN WITH MINERALS) TABS Take 1 tablet by mouth daily.    Historical Provider, MD   BP 109/66 mmHg  Pulse 70  Temp(Src) 98.3 F (36.8 C) (Oral)  Resp 14  SpO2 100% Physical Exam  Constitutional: She is oriented to person, place, and time. She appears well-developed and well-nourished.  Musculoskeletal: She exhibits no tenderness.  Improving extensive ecchymosis to right post thigh , no induration, no erythema, min soreness, ambulatory without assistance.  Neurological: She is alert and oriented to person, place, and time.  Skin: Skin is warm and dry.  Nursing note and vitals reviewed.   ED Course  Procedures (including critical care time) Labs Review Labs Reviewed - No data to display  Imaging Review No results found.   MDM   1. Contusion of right thigh, initial encounter        Linna HoffJames D Orville Widmann, MD 10/17/14 1800

## 2014-10-17 NOTE — ED Notes (Signed)
Pt states that she feel and hit the back of her right leg on a crate

## 2014-10-17 NOTE — Discharge Instructions (Signed)
Ice tonight then use heat twice a day for 10 -14 days, tylenol for soreness. Activity as tolerated.
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
## 2015-05-24 ENCOUNTER — Encounter: Payer: Self-pay | Admitting: Family Medicine

## 2015-05-29 ENCOUNTER — Encounter: Payer: Self-pay | Admitting: Neurology

## 2015-05-29 ENCOUNTER — Encounter: Payer: Self-pay | Admitting: Family Medicine

## 2015-05-29 ENCOUNTER — Ambulatory Visit (INDEPENDENT_AMBULATORY_CARE_PROVIDER_SITE_OTHER): Payer: BLUE CROSS/BLUE SHIELD | Admitting: Neurology

## 2015-05-29 ENCOUNTER — Telehealth: Payer: Self-pay | Admitting: Neurology

## 2015-05-29 ENCOUNTER — Other Ambulatory Visit (HOSPITAL_COMMUNITY)
Admission: RE | Admit: 2015-05-29 | Discharge: 2015-05-29 | Disposition: A | Discharge status: Home / Self Care | Payer: BLUE CROSS/BLUE SHIELD | Source: Ambulatory Visit | Attending: Family Medicine | Admitting: Family Medicine

## 2015-05-29 ENCOUNTER — Ambulatory Visit (INDEPENDENT_AMBULATORY_CARE_PROVIDER_SITE_OTHER): Payer: BLUE CROSS/BLUE SHIELD | Admitting: Family Medicine

## 2015-05-29 VITALS — BP 112/50 | HR 73 | Temp 98.3°F | Ht 64.0 in | Wt 170.5 lb

## 2015-05-29 VITALS — BP 118/70 | HR 64 | Ht 64.0 in | Wt 170.6 lb

## 2015-05-29 DIAGNOSIS — Z113 Encounter for screening for infections with a predominantly sexual mode of transmission: Secondary | ICD-10-CM

## 2015-05-29 DIAGNOSIS — Z8669 Personal history of other diseases of the nervous system and sense organs: Secondary | ICD-10-CM | POA: Diagnosis not present

## 2015-05-29 DIAGNOSIS — Z01419 Encounter for gynecological examination (general) (routine) without abnormal findings: Secondary | ICD-10-CM

## 2015-05-29 DIAGNOSIS — H4710 Unspecified papilledema: Secondary | ICD-10-CM

## 2015-05-29 DIAGNOSIS — Z1151 Encounter for screening for human papillomavirus (HPV): Secondary | ICD-10-CM | POA: Insufficient documentation

## 2015-05-29 DIAGNOSIS — Z0000 Encounter for general adult medical examination without abnormal findings: Secondary | ICD-10-CM

## 2015-05-29 LAB — CBC WITH DIFFERENTIAL/PLATELET
BASOS PCT: 1 % (ref 0–1)
Basophils Absolute: 0.1 10*3/uL (ref 0.0–0.1)
EOS ABS: 0.1 10*3/uL (ref 0.0–0.7)
EOS PCT: 1 % (ref 0–5)
HEMATOCRIT: 37 % (ref 36.0–46.0)
Hemoglobin: 12.4 g/dL (ref 12.0–15.0)
Lymphocytes Relative: 50 % — ABNORMAL HIGH (ref 12–46)
Lymphs Abs: 2.7 10*3/uL (ref 0.7–4.0)
MCH: 29.7 pg (ref 26.0–34.0)
MCHC: 33.5 g/dL (ref 30.0–36.0)
MCV: 88.5 fL (ref 78.0–100.0)
MONO ABS: 0.8 10*3/uL (ref 0.1–1.0)
MPV: 10.2 fL (ref 8.6–12.4)
Monocytes Relative: 15 % — ABNORMAL HIGH (ref 3–12)
NEUTROS PCT: 33 % — AB (ref 43–77)
Neutro Abs: 1.8 10*3/uL (ref 1.7–7.7)
PLATELETS: 337 10*3/uL (ref 150–400)
RBC: 4.18 MIL/uL (ref 3.87–5.11)
RDW: 13 % (ref 11.5–15.5)
WBC: 5.4 10*3/uL (ref 4.0–10.5)

## 2015-05-29 LAB — POCT WET PREP (WET MOUNT): CLUE CELLS WET PREP WHIFF POC: POSITIVE

## 2015-05-29 MED ORDER — ACETAZOLAMIDE 250 MG PO TABS: 250.0000 mg | ORAL_TABLET | Freq: Two times a day (BID) | ORAL | Status: DC

## 2015-05-29 NOTE — Progress Notes (Addendum)
NEUROLOGY CLINIC NEW PATIENT NOTE  NAME: SARABELLE GENSON DOB: 05/19/1971 REFERRING PHYSICIAN: Billie Ruddy, O.D   I saw Dorris Singh as a new consult in the neurovascular clinic today regarding  Chief Complaint  Patient presents with  . Neck Pain    new patent NECK PAIN AND HEAD PAIN  .  HPI: VIDHI DELELLIS is a 44 y.o. 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   She stated that for the last several years she started to feel pressure-like feeling in the back of her head and upper neck, this feeling comes and goes, happens every week lasting hours. For the last 1 year also she started to have episodic vision changes, especially in the morning, with vision suddenly goes greyish for several seconds. It happens once or twice a months. She denies any frank headache, patient loss, nausea vomiting, loss of consciousness. She admitted that she normally had photophobia and phonophobia, which has been for her long time. She went to see her ophthalmologist Dr. Sherryle Lis for annual check 2 weeks ago, was found to have 3+ bilateral papilledema, concerning for pseudotumor cerebri, and referred her for neurology consult.  She stated that about 27 years ago she had headache, mild right arm weakness, and mild problem with coordination. 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 However she cannot remember what the medical term of that condition. She stated that after surgery or her symptoms were gone.  She denies any history of migraine, hypertension, diabetes, hyperlipidemia. She stated that she gained 10 pounds this year, and now she is 170 pounds. She also admit that she also bathroom a lot.  She denies smoking, drinking or illicit drugs. She works in [employer], stressful  job, she also experiences recent years of anxiety due to being single mom, insurance problems among others. 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  History reviewed. No pertinent past medical history. Past Surgical History  Procedure Laterality Date  . Oophrectomy Left   . Neck sugery     Family History  Problem Relation Age of Onset  . Hypertension Mother   . Hypertension Father    Current Outpatient Prescriptions  Medication Sig Dispense Refill  . azithromycin (ZITHROMAX Z-PAK) 250 MG tablet Take as directed. 6 tablet 0  . Multiple Vitamin (MULTIVITAMIN WITH MINERALS) TABS Take 1 tablet by mouth daily.    . vitamin B-12 (CYANOCOBALAMIN) 100 MCG tablet Take 100 mcg by mouth daily.    . vitamin E 100 UNIT capsule Take 100 Units by mouth daily.    Marland Kitchen acetaZOLAMIDE (DIAMOX) 250 MG tablet Take 1 tablet (250 mg total) by mouth 2 (two) times daily.  bid for a wk and then 250 in am and 500 in pm for a wk and then  bid. 120 tablet 2  . clindamycin (CLEOCIN) 150 MG capsule Take 1 capsule (150 mg total) by mouth 3 (three) times daily. (Patient not taking: Reported on 05/29/2015) 20 capsule 0  . guaiFENesin-codeine (GUIATUSS AC) 100-10 MG/5ML syrup Take 10 mLs by mouth 4 (four) times daily as needed for cough. (Patient not taking: Reported on 05/29/2015) 120 mL  0   No current facility-administered medications for this visit.   No Known Allergies History   Social History  . Marital Status: Divorced    Spouse Name: N/A  . Number of Children: N/A  . Years of Education: N/A   Occupational History  . Not on file.   Social History Main Topics  . Smoking status: Never Smoker   . Smokeless tobacco: Never Used  . Alcohol Use: No  . Drug Use: No  . Sexual Activity: Yes   Other Topics Concern  . Not on file   Social History  Narrative    Review of Systems Full 14 system review of systems performed and notable only for those listed, all others are neg:  Constitutional:   Cardiovascular:  Ear/Nose/Throat:   Skin: Moles Eyes:  Vision loss at times Respiratory:   Gastroitestinal:   Genitourinary: Frequent urination Hematology/Lymphatic:   Endocrine: Feeling cold Musculoskeletal:  Aching muscles Allergy/Immunology:   Neurological:   Psychiatric: Depression, anxiety, recent thoughts Sleep:   Physical Exam  Filed Vitals:   05/29/15 0942  BP: 118/70  Pulse: 64    General - Well nourished, well developed, mildly anxious, had one crying spell during visit.  Ophthalmologic - bilateral papilledema seen under opthalmoscope. Not able to see normal structure of optic disc and cup.  Cardiovascular - Regular rate and rhythm with no murmur. Carotid pulses were 2+ without bruits .   Neck - supple, no nuchal rigidity. Surgical scar in the middle of neck.  Mental Status -  Level of arousal and orientation to time, place, and person were intact. Language including expression, naming, repetition, comprehension, reading, and writing was assessed and found intact. Attention span and concentration were normal. Recent and remote memory were intact. Fund of Knowledge was assessed and was intact.  Cranial Nerves II - XII - II - Visual field intact OU. III, IV, VI - Extraocular movements intact. V - Facial sensation intact bilaterally. VII - Facial movement intact bilaterally. VIII - Hearing & vestibular intact bilaterally. X - Palate elevates symmetrically. XI - Chin turning & shoulder shrug intact bilaterally. XII - Tongue protrusion intact.  Motor Strength - The patient's strength was normal in all extremities and pronator drift was absent.  Bulk was normal and fasciculations were absent.   Motor Tone - Muscle tone was assessed at the neck and appendages and was normal.  Reflexes - The patient's reflexes  were normal in all extremities and she had no pathological reflexes.  Sensory - Light touch, temperature/pinprick subjectively decreased on the left hand only, and Romberg testing showed teetering but no fall, patient stated that she felt to be pulled to the left side.    Coordination - The patient had normal movements in the hands and feet with no ataxia or dysmetria.  Tremor was absent.  Gait and Station - The patient's transfers, posture, gait, station, and turns were observed as normal.   Imaging None  Lab Review None    Assessment and Plan:   In summary, JAMILETH PUTZIER is a 44 y.o. female with PMH of likely Chiari malformation status post surgical decompression presents with bilateral papilledema. She does endorse symptoms of pressure at back of head and neck, episodic transient visual loss, especially in the morning, with bilateral papilledema on fundus exam, her condition consistent with chronic intracranial hypertension. Patient no acute severe headache, vision loss, indicating the process is chronic over the years. Since patient had history of surgical intervention for possible Chiari malformation,  she need MRI to rule out hydrocephalus and other structural lesions, especially surrounding with surgical sites. Depends on the MRI results, we will decide on lumbar puncture or EVD. We will also start Diamox for decreasing ICP. Due to previous surgical history and no evidence of obesity, pseudotumor cerebri is less likely however still remain in differentials.  - will do MRI bain and cervical spine with and without contrast  - also MRV to rule out CVT - diamox 250mg  bid with titrating to 500mg  bid in two weeks. Pt was counselled on possible side effects including perioral and finger numbness, taste changes and polyuria.  - provide letter for out of week at least two weeks - continue to follow up with ophthalmologist  - discussed with pt if she experiences acute onset severe  headache, change of headache pattern, vision loss, vision changes, she may need to go to nearby ER for evaluation.  - follow up in 3-4 weeks.   Thank you very much for the opportunity to participate in the care of this patient.  Please do not hesitate to call if any questions or concerns arise.  Orders Placed This Encounter  Procedures  . MR Brain W Wo Contrast    Standing Status: Future     Number of Occurrences:      Standing Expiration Date: 07/28/2016    Scheduling Instructions:     Schedule within a week. Thanks.    Order Specific Question:  Reason for Exam (SYMPTOM  OR DIAGNOSIS REQUIRED)    Answer:  papilledema    Order Specific Question:  Preferred imaging location?    Answer:  Internal    Order Specific Question:  Does the patient have a pacemaker or implanted devices?    Answer:  No    Order Specific Question:  What is the patient's sedation requirement?    Answer:  No Sedation  . MR Cervical Spine W Wo Contrast    Standing Status: Future     Number of Occurrences:      Standing Expiration Date: 07/30/2016    Scheduling Instructions:     Schedule within one week. Thanks.    Order Specific Question:  Reason for Exam (SYMPTOM  OR DIAGNOSIS REQUIRED)    Answer:  papilledema    Order Specific Question:  Preferred imaging location?    Answer:  Internal    Order Specific Question:  Does the patient have a pacemaker or implanted devices?    Answer:  No    Order Specific Question:  What is the patient's sedation requirement?    Answer:  No Sedation    Meds ordered this encounter  Medications  . vitamin E 100 UNIT capsule    Sig: Take 100 Units by mouth daily.  . vitamin B-12 (CYANOCOBALAMIN) 100 MCG tablet    Sig: Take 100 mcg by mouth daily.  Marland Kitchen acetaZOLAMIDE (DIAMOX) 250 MG tablet    Sig: Take 1 tablet (250 mg total) by mouth 2 (two) times daily. 250mg  bid for a wk and then 250 in am and 500 in pm for a wk and then 500mg  bid.    Dispense:  120 tablet    Refill:  2     Patient Instructions  - you may have increased pressure in your brain building up slowly which causing your symptoms - will do MRI bain and cervical spine with and without contrast to look at brain structure.  - take medication called diamox 250mg  twice a day for a week and  then 250 in morning and 500 in evening for a week and then 500mg  twice a day after - will provide letter for you out of week for at least two weeks - continue to follow up with ophthalmologist  - if you experience acute onset severe headache, change of headache pattern, vision loss, vision changes, you may need to go to nearby ER for evaluation. You can always call us and let us know. - follow up in 3-4 weeks.    Marvel Plan, MD PhD [facility] [address], [address] [place], [place] [address] [phone]

## 2015-05-29 NOTE — Telephone Encounter (Signed)
Pt called, her work advised her to call and make sure that we put the date of 8/1 vs date of service 8/8 on short term disab paperwork that will be coming over

## 2015-05-29 NOTE — Addendum Note (Signed)
Addended by: Marvel Plan on: 05/29/2015 10:43 PM   Modules accepted: Orders

## 2015-05-29 NOTE — Patient Instructions (Signed)
It was great seeing you today.   I have order some labs today to check your electrolytes, kidney function and red blood cells. I will send you a letter with the results, or call you if we need to make any changes to your current therapies.  I recommend you taking a prenatal vitamin daily, as well as calcium 1200 mg and vitamin D 800 IU.    Please bring all your medications to every doctors visit  Sign up for My Chart to have easy access to your labs results, and communication with your Primary care physician.  Next Appointment  Please call to make an appointment with Dr Gayla Doss in 1 year   I look forward to talking with you again at our next visit. If you have any questions or concerns before then, please call the clinic at [phone].  Take Care,   Dr Wenda Low

## 2015-05-29 NOTE — Patient Instructions (Signed)
-   you may have increased pressure in your brain building up slowly which causing your symptoms - will do MRI bain and cervical spine with and without contrast to look at brain structure.  - take medication called diamox  twice a day for a week and then 250 in morning and 500 in evening for a week and then  twice a day after - will provide letter for you out of week for at least two weeks - continue to follow up with ophthalmologist  - if you experience acute onset severe headache, change of headache pattern, vision loss, vision changes, you may need to go to nearby ER for evaluation. You can always call us and let us know. - follow up in 3-4 weeks.

## 2015-05-30 ENCOUNTER — Other Ambulatory Visit: Payer: Self-pay | Admitting: Family Medicine

## 2015-05-30 LAB — COMPREHENSIVE METABOLIC PANEL
ALT: 16 U/L (ref 6–29)
AST: 17 U/L (ref 10–35)
Albumin: 4.1 g/dL (ref 3.6–5.1)
Alkaline Phosphatase: 52 U/L (ref 33–115)
BILIRUBIN TOTAL: 0.8 mg/dL (ref 0.2–1.2)
BUN: 12 mg/dL (ref 7–25)
CO2: 30 mmol/L (ref 20–31)
CREATININE: 0.8 mg/dL (ref 0.50–1.10)
Calcium: 9.4 mg/dL (ref 8.6–10.2)
Chloride: 103 mmol/L (ref 98–110)
GLUCOSE: 70 mg/dL (ref 65–99)
Potassium: 3.9 mmol/L (ref 3.5–5.3)
Sodium: 139 mmol/L (ref 135–146)
TOTAL PROTEIN: 7.5 g/dL (ref 6.1–8.1)

## 2015-05-30 LAB — RPR

## 2015-05-30 LAB — CERVICOVAGINAL ANCILLARY ONLY
CHLAMYDIA, DNA PROBE: NEGATIVE
Neisseria Gonorrhea: NEGATIVE

## 2015-05-30 LAB — HIV ANTIBODY (ROUTINE TESTING W REFLEX): HIV 1&2 Ab, 4th Generation: NONREACTIVE

## 2015-05-30 NOTE — Assessment & Plan Note (Signed)
Pap smear completed - Screened for STDs - Discussed mammograms; no family history of breast cancer.  She has started mammograms for screening - Recommended taking prenatal vitamins. Is not using birth control - check bmet and cbc

## 2015-05-30 NOTE — Progress Notes (Signed)
  Patient name: Joy Gentry MRN [id number]  Date of birth: May 10, 1971  CC & HPI:  Joy Gentry is a 44 y.o. female presenting today for yearly physical and pap.  Reports seen by neurologist today for posterior neck and head pain and papilledema concerning for intracranial hypertension.  History of Arnold-Chiari malformation status post surgery.  She has follow-up with neurology and ophthalmology and is scheduled for an MRI.   OB/GYN - Not currently using birth control; sexually active with one partner.  Reports they try to get pregnant for a year unsuccessfully.  She is not currently trying to get pregnant, nor prevent pregnancy.  Has 3 children.  LMP 04/26/15; regular. - History of gonorrhea  ROS: See HPI   Medical & Surgical Hx:  Reviewed  Medications & Allergies: Reviewed  Social History: Reviewed:   Objective Findings:  Vitals: BP 112/50 mmHg  Pulse 73  Temp(Src) 98.3 F (36.8 C) (Oral)  Ht  (1.626 m)  Wt 170 lb 8 oz (77.338 kg)  BMI 29.25 kg/m2  LMP 04/26/2015 (Approximate)  Gen: NAD CV: RRR w/o m/r/g, pulses +2 b/l Resp: CTAB w/ normal respiratory effort Speculum Exam: Ext genitalia: wnl; Vaginal discharge: minimal ; Cervix: wnl  Assessment & Plan:   Please See Problem Focused Assessment & Plan

## 2015-05-30 NOTE — Assessment & Plan Note (Signed)
History and symptoms concerning for intracranial hypertension.  Has follow-up with neurologist and ophthalmologist, and is scheduled for MRI.

## 2015-05-31 LAB — CYTOLOGY - PAP

## 2015-06-01 ENCOUNTER — Telehealth: Payer: Self-pay | Admitting: *Deleted

## 2015-06-01 ENCOUNTER — Ambulatory Visit (INDEPENDENT_AMBULATORY_CARE_PROVIDER_SITE_OTHER): Payer: BLUE CROSS/BLUE SHIELD

## 2015-06-01 ENCOUNTER — Ambulatory Visit (HOSPITAL_COMMUNITY): Payer: PRIVATE HEALTH INSURANCE

## 2015-06-01 ENCOUNTER — Ambulatory Visit (INDEPENDENT_AMBULATORY_CARE_PROVIDER_SITE_OTHER): Payer: Self-pay

## 2015-06-01 ENCOUNTER — Other Ambulatory Visit: Payer: Self-pay

## 2015-06-01 DIAGNOSIS — Z0289 Encounter for other administrative examinations: Secondary | ICD-10-CM

## 2015-06-01 DIAGNOSIS — Z8669 Personal history of other diseases of the nervous system and sense organs: Secondary | ICD-10-CM

## 2015-06-01 DIAGNOSIS — H4710 Unspecified papilledema: Secondary | ICD-10-CM | POA: Diagnosis not present

## 2015-06-01 NOTE — Telephone Encounter (Signed)
Form,[employer] sent to Katrina and Dr Roda Shutters 06/01/15.

## 2015-06-02 ENCOUNTER — Telehealth: Payer: Self-pay | Admitting: Neurology

## 2015-06-02 ENCOUNTER — Other Ambulatory Visit: Payer: Self-pay | Admitting: Diagnostic Neuroimaging

## 2015-06-02 DIAGNOSIS — Z8669 Personal history of other diseases of the nervous system and sense organs: Secondary | ICD-10-CM

## 2015-06-02 DIAGNOSIS — G932 Benign intracranial hypertension: Secondary | ICD-10-CM

## 2015-06-02 DIAGNOSIS — H4710 Unspecified papilledema: Secondary | ICD-10-CM

## 2015-06-02 NOTE — Telephone Encounter (Signed)
Called patient to deliver MRI result to her. According to the MRI images and result, it is most likely consistent with pseudotumor cerebri with evidence of increased intracranial hypertension. Although patient has been started on Diamox, we would like to have therapeutic lumbar puncture for pressure release. Since I'm not in clinic next week, I would like to schedule patient to have this procedure done under fluoroscopy guidance. All her questions were answered. Patient expressed understanding and appreciation.  Marvel Plan, MD PhD Stroke Neurology 06/02/2015 5:02 PM

## 2015-06-02 NOTE — Telephone Encounter (Signed)
Rn talk to patient about her disability claim form. Rn explain to patient that the form from [employer] has been completed and fax to the company. Patient stated she will cal the disability claims dept to make sure it was receive.

## 2015-06-07 NOTE — Telephone Encounter (Signed)
Patient is calling to find out when her spinal tap will be scheduled. She also wants to let Dr. Roda Shutters know that she had headaches yesterday with pressure in her head and neck which was very alarming to her. She is feeling better today. Please call. Thank you.

## 2015-06-07 NOTE — Telephone Encounter (Signed)
Rn call patient about [facility] for her lumbar puncture. Rn stated to patient Dr.Xu would like for her to have the procedure under fluoroscopy. [facility] was call and left a voice message. Patient states she understand and will wait for the appt. Patient stated her headaches have gotten better.

## 2015-06-07 NOTE — Telephone Encounter (Signed)
Patient is calling back again about spinal tap appt. Dr Roda Shutters had advised her that this would be set up some time this week and patient is concerned that today is Wednesday and still no one has called her.

## 2015-06-07 NOTE — Telephone Encounter (Signed)
Hi, Katrina:  I have put in the fluro-guide LP by [facility]. They should call her. Could you please follow up on that? She really needs early LP by them. Thanks a lot.  Marvel Plan, MD PhD Stroke Neurology 06/07/2015 6:24 PM

## 2015-06-08 ENCOUNTER — Telehealth: Payer: Self-pay

## 2015-06-08 NOTE — Telephone Encounter (Signed)
Fax LP order to [facility] to [phone].

## 2015-06-09 ENCOUNTER — Telehealth: Payer: Self-pay | Admitting: Neurology

## 2015-06-09 NOTE — Telephone Encounter (Signed)
Patient called.

## 2015-06-09 NOTE — Telephone Encounter (Signed)
Talk to patient and she will have her lumbar puncture on June 13, 2015 at [facility].

## 2015-06-09 NOTE — Telephone Encounter (Signed)
Left message for Joy Gentry at [facility] about referral that was fax on 06-08-15. Left message for her to call back. Joy Gentry number is [phone]

## 2015-06-13 ENCOUNTER — Ambulatory Visit
Admission: RE | Admit: 2015-06-13 | Discharge: 2015-06-13 | Disposition: A | Discharge status: Home / Self Care | Payer: BLUE CROSS/BLUE SHIELD | Source: Ambulatory Visit | Attending: Neurology | Admitting: Neurology

## 2015-06-13 ENCOUNTER — Telehealth: Payer: Self-pay | Admitting: Neurology

## 2015-06-13 ENCOUNTER — Other Ambulatory Visit: Payer: Self-pay | Admitting: Neurology

## 2015-06-13 ENCOUNTER — Telehealth: Payer: Self-pay

## 2015-06-13 DIAGNOSIS — G932 Benign intracranial hypertension: Secondary | ICD-10-CM

## 2015-06-13 LAB — CSF CELL COUNT WITH DIFFERENTIAL
RBC Count, CSF: 5 cu mm — ABNORMAL HIGH
Tube #: 3
WBC, CSF: 11 cu mm — ABNORMAL HIGH (ref 0–5)

## 2015-06-13 LAB — PROTEIN, CSF: Total Protein, CSF: 31 mg/dL (ref 15–45)

## 2015-06-13 LAB — GLUCOSE, CSF: Glucose, CSF: 63 mg/dL (ref 43–76)

## 2015-06-13 NOTE — Telephone Encounter (Signed)
Rn receive a call from Wallace at [facility] concerning patients WBC is 11. The report was fax and over and given to Dr. Roda Shutters which is patients md.

## 2015-06-13 NOTE — Telephone Encounter (Signed)
[facility] [phone] called with [facility] High WBC 11

## 2015-06-13 NOTE — Telephone Encounter (Signed)
Noted the WBC 11, however, with normal protein and glucose and asymptomatic clinically, really low suspicious for CNS infection. However, will continue follow up with CSF culture and add CSF cryptoantigen, AFB smear and culture and viral culture. Order placed.  Marvel Plan, MD PhD Stroke Neurology 06/13/2015 5:20 PM

## 2015-06-13 NOTE — Discharge Instructions (Signed)

## 2015-06-13 NOTE — Progress Notes (Signed)
Resting quietly in recovery area after feeling a bit lightheaded after LP.  Father at bedside.  jkl

## 2015-06-13 NOTE — Telephone Encounter (Signed)
LF voice message for Revonda Standard about Dr Roda Shutters wanting to add on labs to the  pts panel. [facility] [phone]

## 2015-06-13 NOTE — Addendum Note (Signed)
Addended by: Marvel Plan on: 06/13/2015 05:20 PM   Modules accepted: Orders

## 2015-06-14 ENCOUNTER — Telehealth: Payer: Self-pay | Admitting: Neurology

## 2015-06-14 NOTE — Telephone Encounter (Signed)
Revonda Standard with [employer] is calling regarding the patient. She has a question about add on labs for the patient.  Revonda Standard states she needs a call today before 3:30. Thank you

## 2015-06-14 NOTE — Telephone Encounter (Signed)
Patient called stating someone had called her last night after 5p from our office. Please call patient back [phone].

## 2015-06-14 NOTE — Telephone Encounter (Signed)
I called her twice, yesterday and today, to relay the LP result to her but she was not available. Left VM for her to call back.   Marvel Plan, MD PhD Stroke Neurology 06/14/2015 6:15 PM

## 2015-06-15 LAB — CRYPTOCOCCAL AG, LTX SCR RFLX TITER: CRYPTOCOCCAL AG SCREEN: NOT DETECTED

## 2015-06-15 NOTE — Telephone Encounter (Signed)
Are you kidding me? Those are the tests for CSF to rule out meningitis. The AFB smear and viral culture need to be done with CSF samples. Nothing to do with stool. I ordered specifically for CSF not for stool. They should be able to do it with the CSF sample they have for the pt. Please let them know. If any problems, please notify me. Thanks.  Marvel Plan, MD PhD Stroke Neurology 06/15/2015 6:48 PM

## 2015-06-15 NOTE — Telephone Encounter (Signed)
Discussed with pt over the phone. 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 I recommended her to lie down most of the day for the next 2-3 days and drink more caffeine drinks, such as coffee or Coke. I expect that it will be better. If so, she is able to go back to work on 06/22/15.  She was informed about CSF results being normal except WBC 11, which I think do not indicating meningitis due to still low WBC count and all other parameters are normal. However, will continue to do further tests to rule out fungal, TB and viral meningitis.   She is taking diamox  bid now and tolerating well. All her questions were answered to her satisfaction. Pt expressed understanding and appreciation.  Marvel Plan, MD PhD Stroke Neurology 06/15/2015 6:54 PM

## 2015-06-15 NOTE — Telephone Encounter (Signed)
Rn receive a call from  [place] with [employer] about pts add on labs. Revonda Standard stated she could only add on the cryptococcal antigen CSF lab. Revonda Standard stated the AFB smear and viral culture requires a stool sample, and they could not be added on. Rn will relay the message to Dr.Xu.

## 2015-06-15 NOTE — Telephone Encounter (Signed)
Called pt today and all questions answered. Please see today's phone note.  Marvel Plan, MD PhD Stroke Neurology 06/15/2015 7:00 PM

## 2015-06-16 ENCOUNTER — Telehealth: Payer: Self-pay

## 2015-06-16 DIAGNOSIS — H4710 Unspecified papilledema: Secondary | ICD-10-CM

## 2015-06-16 LAB — CSF CULTURE: GRAM STAIN: NONE SEEN

## 2015-06-16 LAB — CSF CULTURE W GRAM STAIN: Organism ID, Bacteria: NO GROWTH

## 2015-06-16 NOTE — Telephone Encounter (Signed)
Heather[facility] would like for you to call her [phone] option 3 for Micro

## 2015-06-16 NOTE — Telephone Encounter (Signed)
Spoke to Joy Gentry in lab department at [facility]. She stated the AFB smear can be done with the CSF samples.She will call me back with the viral culture clarification. Rn gave Herbert Seta the number for [facility]. The number for [facility] is [phone].

## 2015-06-16 NOTE — Telephone Encounter (Signed)
Talk to Herbert Seta at [facility] at [phone] about patients viral culture. Herbert Seta stated to do the viral culture lab, we need to know what viruses the doctor are looking for. Nurse will Dr Roda Shutters of this lab.Rn will Heather back next week with the answer.

## 2015-06-16 NOTE — Telephone Encounter (Signed)
To be more specific on viral testing, I think we can cancel the viral culture, but add HSV PCR, entero viral PCR and VDRL. I have put order in, but please call them to see whether they have received it. Thanks.  Marvel Plan, MD PhD Stroke Neurology 06/16/2015 3:56 PM

## 2015-06-19 ENCOUNTER — Telehealth: Payer: Self-pay | Admitting: Neurology

## 2015-06-19 NOTE — Telephone Encounter (Signed)
Patient is calling to get a form signed and dated for return to work. She is having the form faxed to Katrina's attention and would like to return on 9/12. Please call.

## 2015-06-20 NOTE — Telephone Encounter (Signed)
Discussed with her over the phone, answered all of her questions to her satisfaction. She will contact her employer to guide disability form faxed to [place] and she wants to date to put on September 16.  Marvel Plan, MD PhD Stroke Neurology 06/20/2015 5:17 PM

## 2015-06-20 NOTE — Telephone Encounter (Signed)
Patient called again about the disability form to be filled out and disability extended to 9/12. Also she is wondering how often the fluid around her brain needs to be monitored.  Please call.

## 2015-06-21 ENCOUNTER — Telehealth: Payer: Self-pay

## 2015-06-21 NOTE — Telephone Encounter (Signed)
Patient called, spoke with her insurance company after speaking with Dr Roda Shutters. Insurance company is wanting additional info. Fax over statement of why Dr. Roda Shutters thinks time out of work needs to be extended in order for her to start work on the 19th (for supporting documentation) this may suffice vs. additional paperwork. Insurance company needs this today. Fax# [phone] Attn: Short Term Disability Claim # [id number].

## 2015-06-22 NOTE — Telephone Encounter (Signed)
For the CSF labs, it is too bad that they have discarded the sample. I do not think we can do anything at this time.   Marvel Plan, MD PhD Stroke Neurology 06/22/2015 1:39 PM

## 2015-06-22 NOTE — Telephone Encounter (Signed)
Rn talk to patient about letter for her extension for disability. Dr. Roda Shutters did respond on what to put in the letter. Letter will be fax and written today.

## 2015-06-23 ENCOUNTER — Telehealth: Payer: Self-pay

## 2015-06-23 NOTE — Telephone Encounter (Signed)
Letter was fax to pts company for short term disability to [phone] about her neurological issues and extension of being off for work.

## 2015-06-29 NOTE — Telephone Encounter (Signed)
Patient called to let nurse know that her [employer] Case Worker had gotten behind. Will still be sending paperwork to be signed for extension. Patient requests that we get paperwork completed and sent back ASAP.

## 2015-07-03 NOTE — Telephone Encounter (Signed)
I gave the ofv note, mri/mrv, LP results., telephone result notes.  Gave to Geoffery Spruce in MR to fax to [employer] company.

## 2015-07-03 NOTE — Telephone Encounter (Addendum)
Patient called stating Dr Warren Danes letter for [employer] was done as she requested but the rep she talked to today said they need more additional information. They need any supporting document, progress notes, medical records, any telephone conversation notes after procedure for proof of continued disability after 06/22/15 in order for her to return to work on 07/10/15 instead. Please fax to [phone]. Patient said she has been working with them for the past week and has been told different things. She is asking if this cold be asap or she won't receive a check on Friday. Please call patient and advise when this has been done. Patient can be reached at [phone].

## 2015-07-03 NOTE — Telephone Encounter (Signed)
Thanks a lot for your kind help. Do I need to do anything at this point? Thanks.  Marvel Plan, MD PhD Stroke Neurology 07/03/2015 5:38 PM

## 2015-07-04 ENCOUNTER — Telehealth: Payer: Self-pay | Admitting: *Deleted

## 2015-07-04 NOTE — Telephone Encounter (Signed)
Form,[employer] received,completed by Dr Roda Shutters and Katrina she faxed 06/01/15.

## 2015-07-04 NOTE — Telephone Encounter (Signed)
Records faxed to [facility] 07/03/15.

## 2015-07-04 NOTE — Telephone Encounter (Signed)
Form,[employer] Completed by Dr Roda Shutters and Mikeal Hawthorne faxed 06/01/15.

## 2015-07-14 ENCOUNTER — Telehealth: Payer: Self-pay | Admitting: Neurology

## 2015-07-14 DIAGNOSIS — H4710 Unspecified papilledema: Secondary | ICD-10-CM

## 2015-07-14 DIAGNOSIS — Z8669 Personal history of other diseases of the nervous system and sense organs: Secondary | ICD-10-CM

## 2015-07-14 NOTE — Telephone Encounter (Signed)
Patient called states she has gone back to work, one of the side effects of medication is drowsiness. Patient wants to know if she could take  of acetaZOLAMIDE (DIAMOX) 250 MG tablet in the evening. Would this be ok? Patient requests that answer be left on voicemail when you call back.

## 2015-07-14 NOTE — Telephone Encounter (Signed)
LFT vm for patient that a message will be sent to Dr. Roda Shutters about her changing her time of the diamox for evening. Pt stated it makes her drowsy at work.Message routed to Dr Roda Shutters

## 2015-07-15 MED ORDER — ACETAZOLAMIDE 250 MG PO TABS: ORAL_TABLET | ORAL | Status: DC

## 2015-07-15 NOTE — Telephone Encounter (Signed)
Called patient today, and discussed with patient over the phone. She is doing well, going back to work. Complaint of drowsiness during the day, concerning for side effect from Diamox during the day. After discussion, she agreed to continue 500 mg Diamox at night but decreased morning dose of Diamox from 500 to 250. She will let us know if she is tolerating with medication and no headache or vision changes.  She is also encouraged to follow-up with ophthalmologist regarding the papilledema. She is going to make appointment with ophthalmologist next months. All her questions answered to her satisfaction. Patient expressed and standing and appreciation.  Marvel Plan, MD PhD Stroke Neurology 07/15/2015 1:12 PM  Meds ordered this encounter  Medications  . acetaZOLAMIDE (DIAMOX) 250 MG tablet    Sig:  in am and 500 in pm.    Dispense:  120 tablet    Refill:  2

## 2015-08-03 LAB — AFB CULTURE WITH SMEAR (NOT AT ARMC): ACID FAST SMEAR: NONE SEEN

## 2015-08-11 ENCOUNTER — Encounter: Payer: Self-pay | Admitting: Neurology

## 2015-08-17 ENCOUNTER — Emergency Department (INDEPENDENT_AMBULATORY_CARE_PROVIDER_SITE_OTHER)
Admission: EM | Admit: 2015-08-17 | Discharge: 2015-08-17 | Disposition: A | Discharge status: Home / Self Care | Payer: BLUE CROSS/BLUE SHIELD | Source: Home / Self Care | Attending: Emergency Medicine | Admitting: Emergency Medicine

## 2015-08-17 ENCOUNTER — Encounter (HOSPITAL_COMMUNITY): Payer: Self-pay | Admitting: Emergency Medicine

## 2015-08-17 DIAGNOSIS — L501 Idiopathic urticaria: Secondary | ICD-10-CM | POA: Diagnosis not present

## 2015-08-17 MED ORDER — PREDNISONE 10 MG PO TABS
ORAL_TABLET | ORAL | Status: DC
Start: 2015-08-17 — End: 2017-02-05

## 2015-08-17 MED ORDER — METHYLPREDNISOLONE ACETATE 80 MG/ML IJ SUSP
80.0000 mg | Freq: Once | INTRAMUSCULAR | Status: AC
  Administered 2015-08-17: 80 mg via INTRAMUSCULAR

## 2015-08-17 MED ORDER — CETIRIZINE HCL 10 MG PO TABS: 10.0000 mg | ORAL_TABLET | Freq: Every day | ORAL | Status: DC

## 2015-08-17 MED ORDER — METHYLPREDNISOLONE ACETATE 80 MG/ML IJ SUSP
INTRAMUSCULAR | Status: AC
  Filled 2015-08-17: qty 1

## 2015-08-17 NOTE — ED Provider Notes (Signed)
CSN: [id number]     Arrival date & time 08/17/15  1909 History   First MD Initiated Contact with Patient 08/17/15 1941     Chief Complaint  Patient presents with  . Rash   (Consider location/radiation/quality/duration/timing/severity/associated sxs/prior Treatment) HPI  She is a [age] year old woman here for evaluation of rash. She states that the last 5 days she has an itchy rash that comes and goes on various parts of her body. Currently it is on her lower back and right foot. No fevers or chills. No new detergents or soaps. She does state she has had increased stress recently. She has not tried any medicines.  History reviewed. No pertinent past medical history. Past Surgical History  Procedure Laterality Date  . Oophrectomy Left   . Neck sugery     Family History  Problem Relation Age of Onset  . Hypertension Mother   . Hypertension Father    Social History  Substance Use Topics  . Smoking status: Never Smoker   . Smokeless tobacco: Never Used  . Alcohol Use: No   OB History    No data available     Review of Systems As in history of present illness Allergies  Review of patient's allergies indicates no known allergies.  Home Medications   Prior to Admission medications   Medication Sig Start Date End Date Taking? Authorizing Provider  acetaZOLAMIDE (DIAMOX) 250 MG tablet 250mg  in am and 500 in pm. 07/15/15   Marvel PlanJindong Xu, MD  cetirizine (ZYRTEC) 10 MG tablet Take 1 tablet (10 mg total) by mouth daily. 08/17/15   Charm RingsErin J Honig, MD  Multiple Vitamin (MULTIVITAMIN WITH MINERALS) TABS Take 1 tablet by mouth daily.    Historical Provider, MD  predniSONE (DELTASONE) 10 MG tablet Take 6 tablets on day 1, 5 on day 2, 4 on day 3, 3 on day 4, 2 on day 5, and 1 on day 6. 08/17/15   Charm RingsErin J Honig, MD  vitamin B-12 (CYANOCOBALAMIN) 100 MCG tablet Take 100 mcg by mouth daily.    Historical Provider, MD   Meds Ordered and Administered this Visit   Medications  methylPREDNISolone  acetate (DEPO-MEDROL) injection 80 mg (not administered)    BP 111/71 mmHg  Pulse 62  Temp(Src) 97.9 F (36.6 C) (Oral)  Resp 20  SpO2 100%  LMP 04/16/2015 No data found.   Physical Exam  Constitutional: She is oriented to person, place, and time. She appears well-developed and well-nourished. No distress.  Cardiovascular: Normal rate.   Pulmonary/Chest: Effort normal.  Neurological: She is alert and oriented to person, place, and time.  Skin:  Urticarial lesions on right foot and lower back    ED Course  Procedures (including critical care time)  Labs Review Labs Reviewed - No data to display  Imaging Review No results found.    MDM   1. Idiopathic urticaria    Treat with Depo-Medrol here. Prednisone taper to go home with. Recommended Zyrtec or other allergy medicine daily for the next 2-4 weeks. Follow-up as needed.    Charm RingsErin J Honig, MD 08/17/15 2002

## 2015-08-17 NOTE — Discharge Instructions (Signed)
You are getting hives or urticaria. This may be related to your stress. We did a steroid shot today. Start the prednisone taper tomorrow. Take cetirizine or another allergy medicine for the next 2-4 weeks. Follow-up as needed.

## 2015-08-17 NOTE — ED Notes (Signed)
C/o rash all over body  No yard work or any new products used Rash does itch

## 2015-08-22 NOTE — Telephone Encounter (Signed)
Form Faxed by [place].

## 2015-08-28 ENCOUNTER — Encounter: Payer: Self-pay | Admitting: Family Medicine

## 2015-08-29 ENCOUNTER — Telehealth: Payer: Self-pay | Admitting: Neurology

## 2015-08-29 NOTE — Telephone Encounter (Signed)
Pt called sts she has concern about labs dated 06/13/15-/WBC-CSF. Pt sts range 0-5  And her's is 11. Please call and advise

## 2015-08-30 NOTE — Telephone Encounter (Signed)
LFt vm for patient concerning lab work from August. Pt had a lumbar puncture in August 2016. Dr. Roda ShuttersXu has talk to patient about her results. Rn left message for Dr. Roda ShuttersXu that pt wanted more clarification.

## 2015-08-30 NOTE — Telephone Encounter (Signed)
Called pt back but she is not available. I have left message for her to call back.   Marvel PlanJindong Layaan Mott, MD PhD Stroke Neurology 08/30/2015 1:58 PM

## 2015-09-28 ENCOUNTER — Telehealth: Payer: Self-pay | Admitting: Neurology

## 2015-09-28 ENCOUNTER — Other Ambulatory Visit: Payer: Self-pay

## 2015-09-28 DIAGNOSIS — H4710 Unspecified papilledema: Secondary | ICD-10-CM

## 2015-09-28 MED ORDER — ACETAZOLAMIDE 250 MG PO TABS: ORAL_TABLET | ORAL | Status: DC

## 2015-09-28 NOTE — Telephone Encounter (Signed)
Discussed with her over the phone and please refer to the orders note about the conversation.   Joy PlanJindong Chinonso Linker, MD PhD Stroke Neurology 09/28/2015 5:54 PM

## 2015-09-28 NOTE — Addendum Note (Signed)
Addended by: Marvel PlanXU, Ante Arredondo on: 09/28/2015 05:53 PM   Modules accepted: Orders

## 2015-09-28 NOTE — Telephone Encounter (Signed)
Pt called sts she had appt with Dr Sherryle Lisolan (eye). He will send results to Dr Roda ShuttersXu. If she is to continue the acetaZOLAMIDE (DIAMOX) 250 MG tablet she will need refill around 12/25. Please call and advise with results and if she is to continue with medication. She can be reached at [phone]. She sounded confused as to how things work with medication refills.

## 2015-09-28 NOTE — Telephone Encounter (Signed)
Rn call patient back concerning her diamox prescription. Pt stated she recently move [place] and will start a new job next week. Pt stated she will have new insurance and is wondering does she still have to be on diamox., Rn stated she never knew she had to schedule an follow up with Dr. Roda ShuttersXu. Pt is seeing the eye doctor and they will fax over the results of the eye exam. Rn refill Diamox for patient, and stated Dr.Xu will discuss with her next week about the plan.

## 2015-09-28 NOTE — Telephone Encounter (Signed)
Discussed with pt over the phone and refilled her medications to [place] pharmacy [place]. She has no HA or vision problems. She had eye exam today and Dr. Sherryle Lisolan showed her pictures stating that her papilledema is improving. She will has another appointment again in 3 months.   Marvel PlanJindong Kanoe Wanner, MD PhD Stroke Neurology 09/28/2015 5:53 PM  Meds ordered this encounter  Medications  . DISCONTD: acetaZOLAMIDE (DIAMOX) 250 MG tablet    Sig: 250mg  in am and 500 in pm.    Dispense:  120 tablet    Refill:  1  . DISCONTD: acetaZOLAMIDE (DIAMOX) 250 MG tablet    Sig: 250mg  in am and 500 in pm.    Dispense:  120 tablet    Refill:  1  . acetaZOLAMIDE (DIAMOX) 250 MG tablet    Sig: 250mg  in am and 500 in pm.    Dispense:  90 tablet    Refill:  5

## 2016-01-10 ENCOUNTER — Telehealth: Payer: Self-pay | Admitting: Neurology

## 2016-01-10 NOTE — Telephone Encounter (Signed)
Called pt back on the number in the message, but she was not available over the phone. Left VM for her to call back.   Marvel PlanJindong Anup Brigham, MD PhD Stroke Neurology 01/10/2016 5:29 PM

## 2016-01-10 NOTE — Telephone Encounter (Signed)
Pt called stating that for the last couple of months she has had more headaches than normal. She also state she is having an uncomfortable feeling on the back of her neck and shoulders, more like a pressure. It did include the back of her head until she had a spinal tap down. Please call and advise [phone], Pt is requesting a call from Dr. Roda ShuttersXu not the nurse. Thank you

## 2016-01-11 NOTE — Telephone Encounter (Signed)
Patient called to inquire if Dr. Roda ShuttersXu had gotten her message since she hasn't heard anything. Advised, Dr. Roda ShuttersXu called 01/10/16 5:29pm was unable to reach, left VM. Patient states no voicemail was left. Please call [phone].

## 2016-01-11 NOTE — Telephone Encounter (Signed)
Called pt and talked with her over the phone. She stated that she for the last 2 months, started to have mild discomfort at back of her head, and top of shoulders. More frequent HA than before, still the same triggers of not eating on time or not sleep well, no vision changes or dizziness. She still takes diamox 250/500 now but she missed medication from Dec to Jan due to insurance coverage. Denies any stress or depression since moved to [place]. She had follow up with local eye doctor once and was told papilledema was getting better.   I told her that she has 3 options: 1. To get appointment with eye doctor ASAP to check fundi and comparing with last time to see if any worsening of pepilledema. 2. Increase diamox to 500/500, although pt reported drowsiness during the day in the past with 500mg  in the morning. 3. LP again to check opening pressure.   Pt said she would like to do number 1 and will check with eye doctor ASAP and fax over the result to us. She expressed appreciation and understanding.  Marvel PlanJindong Circe Chilton, MD PhD Stroke Neurology 01/11/2016 7:15 PM

## 2016-02-15 ENCOUNTER — Telehealth: Payer: Self-pay | Admitting: Neurology

## 2016-02-15 NOTE — Telephone Encounter (Signed)
Patient called regarding having records sent from eye Doctor. Please call [phone]. Leave detailed voicemail advising how to send records which include pictures.

## 2016-03-19 ENCOUNTER — Telehealth: Payer: Self-pay | Admitting: Neurology

## 2016-03-19 DIAGNOSIS — H4710 Unspecified papilledema: Secondary | ICD-10-CM

## 2016-03-19 NOTE — Telephone Encounter (Signed)
Called pt back on the number on file. However, she was not available. I left VM for her to call back. I will call her later if she does not call back.  Joy PlanJindong [name], MD PhD Stroke Neurology 03/19/2016 5:39 PM

## 2016-03-19 NOTE — Telephone Encounter (Signed)
Pt request to speak with Dr Roda ShuttersXu regarding her appt with optometrist. She was inquiring if provider rec'd report from 4/17. He is requesting she come every 6mths now, he does see progress. If he continues to see progress he will want acetaZOLAMIDE (DIAMOX) 250 MG tablet to be reduced, he said it could eventually cause kidney problems. Operator asked her to send report to call center as I did not see anything current and would forward to Dr Roda ShuttersXu. Pt requested message to be sent to Dr Roda ShuttersXu

## 2016-03-20 ENCOUNTER — Telehealth: Payer: Self-pay | Admitting: Neurology

## 2016-03-20 MED ORDER — ACETAZOLAMIDE 250 MG PO TABS: 250.0000 mg | ORAL_TABLET | Freq: Two times a day (BID) | ORAL | Status: DC

## 2016-03-20 NOTE — Telephone Encounter (Signed)
Discussed with pt over the phone. Please see the other phone note.   Joy PlanJindong Kenadee Gates, MD PhD Stroke Neurology 03/20/2016 7:17 PM

## 2016-03-20 NOTE — Telephone Encounter (Signed)
Message For: O/C                  Taken 31-MAY-17 at 11:15AM by DEF ------------------------------------------------------------  Clois Dupesaller  [place] [facility]   CID  [id number]   Patient  Joy Gentry    Pt's Dr  Roda ShuttersXU            Area Code  336  Phone#  [phone] *  DOB  [birth year]      RE  NEEDS EMAIL TO SEND PHOTO OF BACK OF EYE                                                                Disp:Y/N  Y  If Y = C/B If No Response In 20minutes  ============================================================

## 2016-03-20 NOTE — Telephone Encounter (Signed)
Discussed with pt over the phone. She stated that her eye doctor continues to see improvement of her fundi exam and recommended to stop diamox due to concern of kidney function with long term use of diamox. As far as I know, I do not have much problems with kidney damage with diamox. But I agree that if she dose not need diamox and then she should be off it.   Initially recommended to tape off medication in two weeks and follow up with eye doctor in 07/2016. However, she does not feel comfortable to be off diamox completely at this time. After discussion, we agree on that she will remain on diamox but decrease dose to 250mg  bid until she sees her eye doctor in 07/2016. If continues fundi improvement, we will taper off at that time. She expressed understanding and appreciation.  Marvel PlanJindong Aristotle Lieb, MD PhD Stroke Neurology 03/20/2016 7:16 PM

## 2016-03-20 NOTE — Telephone Encounter (Signed)
Operator returned the call, advised there is not an email address but she could mail photos. She agreed, gave her clinic address

## 2016-08-16 ENCOUNTER — Telehealth: Payer: Self-pay | Admitting: Neurology

## 2016-08-16 NOTE — Telephone Encounter (Signed)
Message sent to Dr. Roda ShuttersXu to call patient about diamox advice. Pt was last seen in 082016 and has no follow up appts at [facility].

## 2016-08-16 NOTE — Telephone Encounter (Signed)
Pt called in about acetaZOLAMIDE (DIAMOX) 250 MG tablet. She has decreased the dosage to 1 pill a day. She does not have insurance and the medication is expensive.Pt is receiving the generic. Please call and advise

## 2016-08-19 ENCOUNTER — Other Ambulatory Visit: Payer: Self-pay | Admitting: Neurology

## 2016-08-19 NOTE — Telephone Encounter (Signed)
Discussed with pt over the phone. She stated that she has no insurance now and actually she has been out of the diamox for 3 months now. She is asymptomatic. She follows with eye doctor every 6 months now, down from every 3 months in the past. So far stable.   I told her that if she is not on medication and asymptomatic with stable eye exam, we do not need to restart the medication. We will continue to observe and follow up. If has symptoms again or eye exam getting worse, we can restart the medication. She is encouraged to work on regaining insurance. She expressed understanding and appreciation.   Marvel PlanJindong Roxsana Riding, MD PhD Stroke Neurology 08/19/2016 12:35 PM

## 2016-10-11 ENCOUNTER — Ambulatory Visit (HOSPITAL_COMMUNITY)
Admission: EM | Admit: 2016-10-11 | Discharge: 2016-10-11 | Disposition: A | Discharge status: Home / Self Care | Payer: BLUE CROSS/BLUE SHIELD | Attending: Family Medicine | Admitting: Family Medicine

## 2016-10-11 ENCOUNTER — Encounter (HOSPITAL_COMMUNITY): Payer: Self-pay | Admitting: Family Medicine

## 2016-10-11 DIAGNOSIS — N760 Acute vaginitis: Secondary | ICD-10-CM | POA: Diagnosis not present

## 2016-10-11 DIAGNOSIS — G932 Benign intracranial hypertension: Secondary | ICD-10-CM | POA: Insufficient documentation

## 2016-10-11 DIAGNOSIS — Z8249 Family history of ischemic heart disease and other diseases of the circulatory system: Secondary | ICD-10-CM | POA: Insufficient documentation

## 2016-10-11 MED ORDER — FLUCONAZOLE 150 MG PO TABS: 150.0000 mg | ORAL_TABLET | Freq: Once | ORAL | 3 refills | Status: AC

## 2016-10-11 MED ORDER — CEFTRIAXONE SODIUM 250 MG IJ SOLR
INTRAMUSCULAR | Status: AC
  Filled 2016-10-11: qty 250

## 2016-10-11 MED ORDER — CEFTRIAXONE SODIUM 250 MG IJ SOLR
250.0000 mg | Freq: Once | INTRAMUSCULAR | Status: AC
  Administered 2016-10-11: 250 mg via INTRAMUSCULAR

## 2016-10-11 MED ORDER — AZITHROMYCIN 250 MG PO TABS
1000.0000 mg | ORAL_TABLET | Freq: Once | ORAL | Status: AC
  Administered 2016-10-11: 1000 mg via ORAL

## 2016-10-11 MED ORDER — AZITHROMYCIN 250 MG PO TABS
ORAL_TABLET | ORAL | Status: AC
  Filled 2016-10-11: qty 4

## 2016-10-11 NOTE — ED Provider Notes (Signed)
[facility]    CSN: [id number] Arrival date & time: 10/11/16  1800     History   Chief Complaint Chief Complaint  Patient presents with  . Vaginal Itching    HPI Joy Gentry is a 45 y.o. female.   This a 45 year old woman who comes in complaining of vaginal irritation. She had sexual relations 2 nights ago for the first time in a long long time. Was unprotected. She has no urinary symptoms.  Patient's past medical history significant for pseudotumor cerebri and dysfunctional uterine bleeding.      History reviewed. No pertinent past medical history.  Patient Active Problem List   Diagnosis Date Noted  . Pseudotumor cerebri 06/02/2015  . 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 History of Chiari malformation 05/29/2015  . DUB (dysfunctional uterine bleeding) 07/22/2013  . Well woman exam 12/02/2012  . ANXIETY 12/18/2006    Past Surgical History:  Procedure Laterality Date  . neck sugery    . oophrectomy Left     OB History    No data available       Home Medications    Prior to Admission medications   Medication Sig Start Date End Date Taking? Authorizing Provider  Multiple Vitamin (MULTIVITAMIN WITH MINERALS) TABS Take 1 tablet by mouth daily.   Yes Historical Provider, MD  vitamin B-12 (CYANOCOBALAMIN) 100 MCG tablet Take 100 mcg by mouth daily.   Yes Historical Provider, MD  cetirizine (ZYRTEC) 10 MG tablet Take 1 tablet (10 mg total) by mouth daily. 08/17/15   Charm RingsErin J Honig, MD  fluconazole (DIFLUCAN) 150 MG tablet Take 1 tablet (150 mg total) by mouth once. Repeat if needed 10/11/16 10/11/16  Elvina SidleKurt Melchor Kirchgessner, MD  predniSONE (DELTASONE) 10 MG tablet Take 6 tablets on day 1, 5 on day 2, 4 on day 3, 3 on day 4, 2 on day 5, and 1 on day 6. 08/17/15   Charm RingsErin J Honig, MD    Family History Family History  Problem Relation Age of Onset  . Hypertension Mother   . Hypertension Father     Social History Social History  Substance Use Topics  .  Smoking status: Never Smoker  . Smokeless tobacco: Never Used  . Alcohol use No     Allergies   Patient has no known allergies.   Review of Systems Review of Systems  Constitutional: Negative.   HENT: Negative.   Genitourinary: Positive for vaginal pain. Negative for difficulty urinating, dyspareunia, dysuria, frequency, genital sores, vaginal bleeding and vaginal discharge.  Neurological: Negative.      Physical Exam Triage Vital Signs ED Triage Vitals  Enc Vitals Group     BP      Pulse      Resp      Temp      Temp src      SpO2      Weight      Height      Head Circumference      Peak Flow      Pain Score      Pain Loc      Pain Edu?      Excl. in GC?    No data found.   Updated Vital Signs BP 113/76   Pulse 67   Temp 98.5 F (36.9 C) (Oral)   Resp 16   Ht 5\' 4"  (1.626 m)   Wt 164 lb (74.4 kg)   LMP 04/16/2015   SpO2 100%   BMI 28.15 kg/m  Visual Acuity Right Eye Distance:   Left Eye Distance:   Bilateral Distance:    Right Eye Near:   Left Eye Near:    Bilateral Near:     Physical Exam  Constitutional: She is oriented to person, place, and time. She appears well-developed and well-nourished.  HENT:  Right Ear: External ear normal.  Left Ear: External ear normal.  Mouth/Throat: Oropharynx is clear and moist.  Eyes: Conjunctivae and EOM are normal.  Neck: Normal range of motion. Neck supple.  Pulmonary/Chest: Effort normal.  Genitourinary:  Genitourinary Comments: Normal external female genitalia Vagina appears pink and healthy with small amount of clumpy white discharge Cervix: No cervical motion tenderness, closed and without discharge  Musculoskeletal: Normal range of motion.  Neurological: She is alert and oriented to person, place, and time.  Skin: Skin is warm and dry.  Nursing note and vitals reviewed.    UC Treatments / Results  Labs (all labs ordered are listed, but only abnormal results are displayed) Labs Reviewed    CERVICOVAGINAL ANCILLARY ONLY    EKG  EKG Interpretation None       Radiology No results found.  Procedures Procedures (including critical care time)  Medications Ordered in UC Medications  cefTRIAXone (ROCEPHIN) injection 250 mg (not administered)  azithromycin (ZITHROMAX) tablet 1,000 mg (not administered)     Initial Impression / Assessment and Plan / UC Course  I have reviewed the triage vital signs and the nursing notes.  Pertinent labs & imaging results that were available during my care of the patient were reviewed by me and considered in my medical decision making (see chart for details).  Clinical Course       Final Clinical Impressions(s) / UC Diagnoses   Final diagnoses:  Acute vaginitis    New Prescriptions New Prescriptions   FLUCONAZOLE (DIFLUCAN) 150 MG TABLET    Take 1 tablet (150 mg total) by mouth once. Repeat if needed     Elvina SidleKurt [name], MD 10/11/16 1845

## 2016-10-11 NOTE — ED Triage Notes (Signed)
PT reports she had unprotected sex 12/20 with a current partner, but previously they have used protection. PT reports vaginal discomfort and itching that started yesterday. PT reports same symptoms and lower abdominal pain today. PT denies vaginal discharge and odor.

## 2016-10-15 LAB — CERVICOVAGINAL ANCILLARY ONLY
Chlamydia: NEGATIVE
Neisseria Gonorrhea: NEGATIVE

## 2016-10-16 LAB — CERVICOVAGINAL ANCILLARY ONLY: Wet Prep (BD Affirm): POSITIVE — AB

## 2016-10-28 ENCOUNTER — Telehealth (HOSPITAL_COMMUNITY): Payer: Self-pay | Admitting: Emergency Medicine

## 2016-10-28 MED ORDER — METRONIDAZOLE 500 MG PO TABS: 500.0000 mg | ORAL_TABLET | Freq: Two times a day (BID) | ORAL | 0 refills | Status: DC

## 2016-10-28 NOTE — Telephone Encounter (Signed)
Pt called back needing lab results from visit on 10/11/16 Notified of recent lab results Pt ID'd properly... Reports she still has vag itching Wanted to know if we could call in something for her Per Dr. Piedad ClimesHonig, ok to call in metronidazole BID x7 days Per pt's request, called in Rx to [facility]([place]) Adv pt if sx are not getting better to return or to f/u w/PCP Pt verb understanding.

## 2017-01-28 ENCOUNTER — Telehealth: Payer: Self-pay | Admitting: Neurology

## 2017-01-28 NOTE — Telephone Encounter (Signed)
I spoke with patient in reference to diamox medication.  Notes below from RN are correct with conversation between patient and myself.  Patient advised from Dr. Roda Shutters to schedule an appointment with [place] due to not being seen since 2016 to start taking diamox again or to have patient fu with her PCP in [place].  Per patient she is back in [place] and has scheduled a fu visit with Dr. Roda Shutters 02/05/17 3:30pm.

## 2017-01-28 NOTE — Telephone Encounter (Signed)
Rn receive message from Valene Bors in phone room that pt would like to discuss restarting diamox medication. Pt last seen August 2016. Pt move to [facility], per Teara in phone room but is now living her in [place].  Rn contacted Dr Roda Shutters. PT can see her PCP for medication or schedule an appt with him. Pt was told by Dr.Xu to discontinue the medication. PT schedule appt on 4/18.2018 for 0330pm per Teara. Pt will bring eye exam. Rn never spoke with patient. This was a message from the phone room.

## 2017-02-05 ENCOUNTER — Ambulatory Visit (INDEPENDENT_AMBULATORY_CARE_PROVIDER_SITE_OTHER): Payer: 59 | Admitting: Neurology

## 2017-02-05 ENCOUNTER — Encounter: Payer: Self-pay | Admitting: Neurology

## 2017-02-05 VITALS — BP 101/58 | HR 76 | Ht 64.0 in | Wt 162.6 lb

## 2017-02-05 DIAGNOSIS — G932 Benign intracranial hypertension: Secondary | ICD-10-CM | POA: Diagnosis not present

## 2017-02-05 DIAGNOSIS — H4710 Unspecified papilledema: Secondary | ICD-10-CM

## 2017-02-05 DIAGNOSIS — Z8669 Personal history of other diseases of the nervous system and sense organs: Secondary | ICD-10-CM

## 2017-02-05 MED ORDER — ACETAZOLAMIDE 250 MG PO TABS: 250.0000 mg | ORAL_TABLET | Freq: Two times a day (BID) | ORAL | 5 refills | Status: DC

## 2017-02-05 NOTE — Patient Instructions (Addendum)
-   will resume diamox  twice a day - let us know if there is any symptoms - follow up with eye doctor every 6 months and to see how the optic nerve changes - follow up in 6 months after your eye exam.

## 2017-02-05 NOTE — Progress Notes (Signed)
NEUROLOGY CLINIC NEW PATIENT NOTE  NAME: Joy Gentry DOB: 02-23-1971 REFERRING PHYSICIAN: Billie Ruddy, O.D   I saw Dorris Singh as a new consult in the neurovascular clinic today regarding  Chief Complaint  Patient presents with  . Follow-up    Patient last seen 05/2015, would like to discuss diamox,and eye report that was given to Dr. Roda Shutters last week. Dr. Roda Shutters has eye exam report. Pt was seen for Papilledema  .  HPI: Joy Gentry is a 46 y.o. 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   She stated that for the last several years she started to feel pressure-like feeling in the back of her head and upper neck, this feeling comes and goes, happens every week lasting hours. For the last 1 year also she started to have episodic vision changes, especially in the morning, with vision suddenly goes greyish for several seconds. It happens once or twice a months. She denies any frank headache, patient loss, nausea vomiting, loss of consciousness. She admitted that she normally had photophobia and phonophobia, which has been for her long time. She went to see her ophthalmologist Dr. Sherryle Lis for annual check 2 weeks ago, was found to have 3+ bilateral papilledema, concerning for pseudotumor cerebri, and referred her for neurology consult.  She stated that about 27 years ago she had headache, mild right arm weakness, and mild problem with coordination. 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 However she cannot remember what the medical term of that condition. She stated that after surgery or her symptoms were gone.  She denies any history of migraine, hypertension, diabetes, hyperlipidemia. She stated that she gained 10 pounds this year, and now she is 170 pounds. She also admit  that she also bathroom a lot.  She denies smoking, drinking or illicit drugs. She works in [employer], stressful job, she also experiences recent years of anxiety due to being single mom, insurance problems among others. 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  Interval history: During the interval time, pt had LP on 06/13/15 and OP was 20cm H2O and CP was 3cm H2O. CSF unremarkable except WBC 11. She is asymptomatic clinically, no concern for CNS infection. CSF culture negative. She was put on diamox and titrated up to  bid. Complaint of drowsiness during the day, concerning for side effect from Diamox and put her on 500 mg Diamox at night and decreased morning dose of Diamox from 500 to 250. Tolerating well. She moved to [place], [place] in 09/2015, but did not follow neurology there. She followed with ophthalmology and continued to see improvement of her fundi exam and recommended to stop diamox due to concern of kidney function with long term use of diamox. However, she does not feel comfortable to be off diamox completely at that time. After discussion, we decreased her dose to . In 04/2016, she lost her insurance so her diamox was stopped by her own. Although she was asymptomatic, recent eye exam on 01/28/17 showed bilateral disc margin indistinct, and Dr. Laural Benes mentioned "swelling is noticeable", and recommend to re-institute therapy. She denies any HA, vision disturbance. She  has new prescription glass to be ready next week.   Past Medical History:  Diagnosis Date  . Vision abnormalities     Papilledema   Past Surgical History:  Procedure Laterality Date  . neck sugery    . oophrectomy Left    Family History  Problem Relation Age of Onset  . Hypertension Mother   . Hypertension Father    Current Outpatient  Prescriptions  Medication Sig Dispense Refill  . Multiple Vitamin (MULTIVITAMIN WITH MINERALS) TABS Take 1 tablet by mouth daily.    . vitamin B-12 (CYANOCOBALAMIN) 100 MCG tablet Take 100 mcg by mouth daily.    Marland Kitchen acetaZOLAMIDE (DIAMOX) 250 MG tablet Take 1 tablet (250 mg total) by mouth 2 (two) times daily. 60 tablet 5   No current facility-administered medications for this visit.    No Known Allergies Social History   Social History  . Marital status: Divorced    Spouse name: N/A  . Number of children: N/A  . Years of education: N/A   Occupational History  . Not on file.   Social History Main Topics  . Smoking status: Never Smoker  . Smokeless tobacco: Never Used  . Alcohol use No  . Drug use: No  . Sexual activity: Yes   Other Topics Concern  . Not on file   Social History Narrative  . No narrative on file    Review of Systems Full 14 system review of systems performed and notable only for those listed, all others are neg:  Constitutional:   Cardiovascular:  Ear/Nose/Throat:   Skin:  Eyes:   Respiratory:   Gastroitestinal:   Genitourinary: Frequent urination Hematology/Lymphatic:   Endocrine:  Musculoskeletal:   Allergy/Immunology:   Neurological:  dizziness Psychiatric:  Sleep:   Physical Exam  Vitals:   02/05/17 1523  BP: (!) 101/58  Pulse: 76    General - Well nourished, well developed, not in acute distress.  Ophthalmologic - Not able to see optic disc due to small pupils and photophobia.  Cardiovascular - Regular rate and rhythm with no murmur. Carotid pulses were 2+ without bruits .   Neck - supple, no nuchal rigidity. Surgical scar in the middle of neck.  Mental Status -  Level of arousal and orientation to time, place, and person were intact. Language including expression, naming, repetition, comprehension, reading, and writing was assessed and found intact. Attention span and concentration were normal. Recent and remote memory were  intact. Fund of Knowledge was assessed and was intact.  Cranial Nerves II - XII - II - Visual field intact OU. III, IV, VI - Extraocular movements intact. V - Facial sensation intact bilaterally. VII - Facial movement intact bilaterally. VIII - Hearing & vestibular intact bilaterally. X - Palate elevates symmetrically. XI - Chin turning & shoulder shrug intact bilaterally. XII - Tongue protrusion intact.  Motor Strength - The patient's strength was normal in all extremities and pronator drift was absent.  Bulk was normal and fasciculations were absent.   Motor Tone - Muscle tone was assessed at the neck and appendages and was normal.  Reflexes - The patient's reflexes were normal in all extremities and she had no pathological reflexes.  Sensory - symmetrical bilaterally    Coordination - The patient had normal movements in the hands and feet with no ataxia or dysmetria.  Tremor was absent.  Gait and Station - The patient's transfers, posture, gait, station, and turns were observed as normal.   Imaging I have personally reviewed  the radiological images below and agree with the radiology interpretations.  MRI brain 06/02/15 1. Sub-occipital decompressive craniectomy. The cerebellar tonsils crowd the posterior fossa and are low lying (11mm) below the foramen magnum. Consistent with patient's history of Chiari malformation status post decompression surgery.  2. Slightly enlarged optic nerve sheaths. Slightly enlarged sella. Findings are nonspecific but can be seen in association with idiopathic intracranial hypertension.  3. No acute findings. No abnormal enhancing lesions.  MRI C-spine 06/02/15 1. Suboccipital decompressive craniectomy. No evidence of syringomyelia. 2. No intrinsic, compressive or abnormal enhancing spinal cord lesions.  MRV head 06/02/15 1. Decreased flow signal in the posterior aspect of superior sagittal sinus, bilateral sigmoid sinuses, and left  transverse sinus.  2. Above findings are nonspecific and may be due to idiopathic intracranial hypertension, normal variant or technical artifact. No definite evidence of focal venous sinus thrombosis.   Fluoro guided LP 06/13/15 opening pressure of 20 cm water. Closing pressure 3 cm H2O.   Lab Review Component     Latest Ref Rng & Units 05/29/2015 06/13/2015          8:33 AM  Tube #       3  Color, CSF     COLORLESS  COLORLESS  Appearance, CSF     CLEAR  CLEAR  Supernatant     COLORLESS  COLORLESS  WBC, CSF     0 - 5 cu mm  11 (H)  RBC Count, CSF     0 cu mm  5 (H)  Segmented Neutrophils-CSF     0 - 6 %  TOO FEW TO COUNT  Lymphs, CSF     40 - 80 %  TOO FEW TO COUNT  Monocyte/Macrophage     15 - 45 %  TOO FEW TO COUNT  Eosinophils, CSF     0 - 1 %  TOO FEW TO COUNT  Gram Stain         Organism ID, Bacteria       No Acid Fast Bacilli Isolated in 6 Weeks  Specimen Source       CSF  Cryptococcal Ag Screen     Not Detected  Not Detected  Additional Testing       REPORT  Acid Fast Smear       No Acid Fast Bacilli Seen  HIV     NONREACTIVE NONREACTIVE   RPR     NON REAC NON REAC   Glucose, CSF     43 - 76 mg/dL  63  Total  Protein, CSF     15 - 45 mg/dL  31   Component     Latest Ref Rng & Units 06/13/2015 06/13/2015         8:33 AM  8:33 AM  Tube #         Color, CSF     COLORLESS    Appearance, CSF     CLEAR    Supernatant     COLORLESS    WBC, CSF     0 - 5 cu mm    RBC Count, CSF     0 cu mm    Segmented Neutrophils-CSF     0 - 6 %    Lymphs, CSF     40 - 80 %    Monocyte/Macrophage     15 - 45 %    Eosinophils, CSF     0 - 1 %    Gram Stain  CYTOSPIN SLIDE Rare  Organism ID, Bacteria      NO GROWTH 3 DAYS   Specimen Source         Cryptococcal Ag Screen     Not Detected    Additional Testing         Acid Fast Smear         HIV     NONREACTIVE    RPR     NON REAC    Glucose, CSF     43 - 76 mg/dL    Total  Protein, CSF      15 - 45 mg/dL     Component     Latest Ref Rng & Units 06/13/2015         8:33 AM  Tube #        Color, CSF     COLORLESS   Appearance, CSF     CLEAR   Supernatant     COLORLESS   WBC, CSF     0 - 5 cu mm   RBC Count, CSF     0 cu mm   Segmented Neutrophils-CSF     0 - 6 %   Lymphs, CSF     40 - 80 %   Monocyte/Macrophage     15 - 45 %   Eosinophils, CSF     0 - 1 %   Gram Stain      WBC present-predominately Mononuclear  Organism ID, Bacteria        Specimen Source        Cryptococcal Ag Screen     Not Detected   Additional Testing        Acid Fast Smear        HIV     NONREACTIVE   RPR     NON REAC   Glucose, CSF     43 - 76 mg/dL   Total  Protein, CSF     15 - 45 mg/dL    Component     Latest Ref Rng & Units 06/13/2015         8:33 AM  Tube #        Color, CSF     COLORLESS   Appearance, CSF     CLEAR   Supernatant     COLORLESS   WBC, CSF     0 - 5 cu mm   RBC Count, CSF     0 cu mm   Segmented Neutrophils-CSF     0 - 6 %   Lymphs, CSF     40 - 80 %   Monocyte/Macrophage     15 - 45 %   Eosinophils, CSF     0 - 1 %   Gram Stain      No Organisms Seen  Organism ID, Bacteria        Specimen Source        Cryptococcal Ag Screen     Not Detected   Additional Testing        Acid Fast Smear        HIV     NONREACTIVE   RPR     NON REAC   Glucose, CSF     43 - 76 mg/dL   Total  Protein, CSF     15 - 45 mg/dL       Assessment:   In summary, ADY HEIMANN is a 47 y.o. female with PMH of likely Chiari malformation s/p surgical  decompression presents in 05/2015 with 3+ bilateral papilledema. She does endorse symptoms of pressure at back of head and neck, episodic transient visual loss, especially in the morning, with bilateral papilledema on fundus exam, her condition consistent with chronic intracranial hypertension. Due to surgical intervention for possible Chiari malformation, had MRI brain and C-spine ruled out  hydrocephalus and syringomyelia. Had LP on 06/13/15 and OP was 20cm H2O and CP was 3cm H2O. CSF unremarkable except WBC 11. She is asymptomatic clinically, no concern for CNS infection. CSF culture negative. She was put on diamox and titrated up to  bid. Complaint of drowsiness during the day, Diamox was decreased to 250/500. Tolerating well. She followed with ophthalmology and continued to see improvement of her fundi exam and recommended to stop diamox due to concern of kidney function with long term use of diamox. However, she does not feel comfortable to be off diamox completely at that time. After discussion, we decreased her dose to  bid. In 04/2016, she lost her insurance so her diamox was stopped by her own. Although she was asymptomatic, recent eye exam on 01/28/17 showed bilateral disc margin indistinct, and Dr. Laural Benes mentioned "swelling is noticeable". She denies any HA, vision disturbance.   Plan: - will resume diamox  twice a day - let us know if there is HA or visual disturbance - follow up with eye doctor every 6 months for fundi exam - follow up in 6 months.  I spent more than 25 minutes of face to face time with the patient. Greater than 50% of time was spent in counseling and coordination of care. We discussed eye exam report, resume diamox and importance to follow up with eye doctor.    No orders of the defined types were placed in this encounter.   Meds ordered this encounter  Medications  . acetaZOLAMIDE (DIAMOX) 250 MG tablet    Sig: Take 1 tablet (250 mg total) by mouth 2 (two) times daily.    Dispense:  60 tablet    Refill:  5    Patient Instructions  - will resume diamox  twice a day - let us know if there is any symptoms - follow up with eye doctor every 6 months and to see how the optic nerve changes - follow up in 6 months after your eye exam.    Marvel Plan, MD PhD [facility] [address], [address] [place], [place]  [address] [phone]

## 2017-03-06 ENCOUNTER — Ambulatory Visit (INDEPENDENT_AMBULATORY_CARE_PROVIDER_SITE_OTHER): Payer: Managed Care, Other (non HMO) | Admitting: Internal Medicine

## 2017-03-06 VITALS — BP 110/88 | HR 65 | Temp 98.3°F | Wt 159.0 lb

## 2017-03-06 DIAGNOSIS — Z01419 Encounter for gynecological examination (general) (routine) without abnormal findings: Secondary | ICD-10-CM

## 2017-03-06 DIAGNOSIS — Z5181 Encounter for therapeutic drug level monitoring: Secondary | ICD-10-CM

## 2017-03-06 DIAGNOSIS — Z3009 Encounter for other general counseling and advice on contraception: Secondary | ICD-10-CM

## 2017-03-06 DIAGNOSIS — R634 Abnormal weight loss: Secondary | ICD-10-CM

## 2017-03-06 DIAGNOSIS — Z13220 Encounter for screening for lipoid disorders: Secondary | ICD-10-CM | POA: Diagnosis not present

## 2017-03-06 DIAGNOSIS — Z131 Encounter for screening for diabetes mellitus: Secondary | ICD-10-CM

## 2017-03-06 LAB — POCT GLYCOSYLATED HEMOGLOBIN (HGB A1C): Hemoglobin A1C: 5.2

## 2017-03-06 NOTE — Progress Notes (Signed)
46 y.o. year old female presents for well woman/preventative visit and annual GYN examination.  Acute Concerns:  - Would like to discuss birth control options. LMP was 1 month ago and was heavy. Has had three real periods in the last year, otherwise has had occasional monthly spotting. - Unexplained weight loss. Patient exercising regularly, but has not increased her exercise frequency or intensity recently. Not eating any differently. Has lost ~20 lbs in the last few years. Also noted a 4 lb weight loss in the last month, but her weight was being checked on two different scales. No fevers, no chills, no night sweats.  Exercise: Exercises 3 times per week, walks at a fast pace. Resistance training 3 times per week.  Sexual/Birth History: Sexually active with ex-husband, has gotten back together with him. Planning to get remarried.  Birth Control: Has been on OCPs years ago. Has also been on depo, but this caused worsening of her headaches. She is interested in the IUD.  Social:  Social History   Social History  . Marital status: Divorced    Spouse name: N/A  . Number of children: N/A  . Years of education: N/A   Social History Main Topics  . Smoking status: Never Smoker  . Smokeless tobacco: Never Used  . Alcohol use No  . Drug use: No  . Sexual activity: Yes   Other Topics Concern  . Not on file   Social History Narrative  . No narrative on file    Immunization: Immunization History  Administered Date(s) Administered  . Td 01/19/2002  . Tdap 12/01/2012    Cancer Screening:  Pap Smear: Last pap 05/29/2015- negative. Due for repeat pap 05/2018.  Mammogram: Last mammogram 12/29/2012- negative for malignancy. Discussed continuing mammograms yearly vs restarting mammograms at age 46. Patient would like to wait until age 46. No family history of breast cancer.  Colonoscopy: Not indicated  Dexa: Not indicated  Physical Exam: VITALS: Reviewed GEN: Pleasant female, NAD HEENT:  Normocephalic, PERRL, EOMI, no scleral icterus, bilateral TM pearly grey, nasal septum midline, MMM, uvula midline, no anterior or posterior lymphadenopathy, no thyromegaly CARDIAC:RRR, S1 and S2 present, no murmur, no heaves/thrills RESP: CTAB, normal effort ABD: soft, no tenderness, normal bowel sounds EXT: No edema, 2+ radial and DP pulses SKIN: no rash  ASSESSMENT & PLAN: 46 y.o. female presents for annual well woman/preventative exam and GYN exam. Please see problem specific assessment and plan.   Well Woman Exam: - Will check A1c, lipid panel to screen for diabetes and hyperlipidemia. - Follow-up in 1 year for pap and repeat exam  Encounter for Birth Control Counseling: Patient is sexually active and perimenopausal. Still having a few regular periods every year. Would like to be on birth control because she does not want to get pregnant. Different birth control options discussed in detail. - Patient will schedule appointment to have IUD placed  Unintentional Weight Loss: Has lost ~20 lbs in the last few years. Not eating or exercising any differently. Also notes 4lb weight loss in the last month, but she was weighed on two different scales. No fevers, no chills, no night sweats. - Will order CBC, BMET, and TSH - Will continue to monitor over time   Willadean CarolKaty Jannett Schmall, MD PGY-2

## 2017-03-06 NOTE — Assessment & Plan Note (Signed)
Patient is sexually active and perimenopausal. Still having a few regular periods every year. Would like to be on birth control because she does not want to get pregnant. Different birth control options discussed in detail. - Patient will schedule appointment to have IUD placed

## 2017-03-06 NOTE — Assessment & Plan Note (Signed)
Has lost ~20 lbs in the last few years. Not eating or exercising any differently. Also notes 4lb weight loss in the last month, but she was weighed on two different scales. No fevers, no chills, no night sweats. - Will order CBC, BMET, and TSH - Will continue to monitor over time

## 2017-03-06 NOTE — Assessment & Plan Note (Signed)
-   Will check A1c, lipid panel to screen for diabetes and hyperlipidemia. - Follow-up in 1 year for pap and repeat exam

## 2017-03-06 NOTE — Patient Instructions (Signed)
It was so nice to meet you!  Everything looks great today. I have ordered a lot of labs for you and I will call you with these results.  Please schedule an appointment to have the Mirena placed.  -Dr. Nancy MarusMayo  Levonorgestrel intrauterine device (IUD) What is this medicine? LEVONORGESTREL IUD (LEE voe nor jes trel) is a contraceptive (birth control) device. The device is placed inside the uterus by a healthcare professional. It is used to prevent pregnancy. This device can also be used to treat heavy bleeding that occurs during your period. This medicine may be used for other purposes; ask your health care provider or pharmacist if you have questions. COMMON BRAND NAME(S): Cameron AliKyleena, LILETTA, Mirena, Skyla What should I tell my health care provider before I take this medicine? They need to know if you have any of these conditions: -abnormal Pap smear -cancer of the breast, uterus, or cervix -diabetes -endometritis -genital or pelvic infection now or in the past -have more than one sexual partner or your partner has more than one partner -heart disease -history of an ectopic or tubal pregnancy -immune system problems -IUD in place -liver disease or tumor -problems with blood clots or take blood-thinners -seizures -use intravenous drugs -uterus of unusual shape -vaginal bleeding that has not been explained -an unusual or allergic reaction to levonorgestrel, other hormones, silicone, or polyethylene, medicines, foods, dyes, or preservatives -pregnant or trying to get pregnant -breast-feeding How should I use this medicine? This device is placed inside the uterus by a health care professional. Talk to your pediatrician regarding the use of this medicine in children. Special care may be needed. Overdosage: If you think you have taken too much of this medicine contact a poison control center or emergency room at once. NOTE: This medicine is only for you. Do not share this medicine with  others. What if I miss a dose? This does not apply. Depending on the brand of device you have inserted, the device will need to be replaced every 3 to 5 years if you wish to continue using this type of birth control. What may interact with this medicine? Do not take this medicine with any of the following medications: -amprenavir -bosentan -fosamprenavir This medicine may also interact with the following medications: -aprepitant -armodafinil -barbiturate medicines for inducing sleep or treating seizures -bexarotene -boceprevir -griseofulvin -medicines to treat seizures like carbamazepine, ethotoin, felbamate, oxcarbazepine, phenytoin, topiramate -modafinil -pioglitazone -rifabutin -rifampin -rifapentine -some medicines to treat HIV infection like atazanavir, efavirenz, indinavir, lopinavir, nelfinavir, tipranavir, ritonavir -St. John's wort -warfarin This list may not describe all possible interactions. Give your health care provider a list of all the medicines, herbs, non-prescription drugs, or dietary supplements you use. Also tell them if you smoke, drink alcohol, or use illegal drugs. Some items may interact with your medicine. What should I watch for while using this medicine? Visit your doctor or health care professional for regular check ups. See your doctor if you or your partner has sexual contact with others, becomes HIV positive, or gets a sexual transmitted disease. This product does not protect you against HIV infection (AIDS) or other sexually transmitted diseases. You can check the placement of the IUD yourself by reaching up to the top of your vagina with clean fingers to feel the threads. Do not pull on the threads. It is a good habit to check placement after each menstrual period. Call your doctor right away if you feel more of the IUD than just the threads or if  you cannot feel the threads at all. The IUD may come out by itself. You may become pregnant if the device  comes out. If you notice that the IUD has come out use a backup birth control method like condoms and call your health care provider. Using tampons will not change the position of the IUD and are okay to use during your period. This IUD can be safely scanned with magnetic resonance imaging (MRI) only under specific conditions. Before you have an MRI, tell your healthcare provider that you have an IUD in place, and which type of IUD you have in place. What side effects may I notice from receiving this medicine? Side effects that you should report to your doctor or health care professional as soon as possible: -allergic reactions like skin rash, itching or hives, swelling of the face, lips, or tongue -fever, flu-like symptoms -genital sores -high blood pressure -no menstrual period for 6 weeks during use -pelvic pain or tenderness -severe or sudden headache -signs of pregnancy -stomach cramping -sudden shortness of breath -trouble with balance, talking, or walking -unusual vaginal bleeding, discharge -yellowing of the eyes or skin Side effects that usually do not require medical attention (report to your doctor or health care professional if they continue or are bothersome): -acne -breast pain -change in sex drive or performance -changes in weight -cramping, dizziness, or faintness while the device is being inserted -headache -irregular menstrual bleeding within first 3 to 6 months of use -nausea This list may not describe all possible side effects. Call your doctor for medical advice about side effects. You may report side effects to FDA at 1-800-FDA-1088. Where should I keep my medicine? This does not apply. NOTE: This sheet is a summary. It may not cover all possible information. If you have questions about this medicine, talk to your doctor, pharmacist, or health care provider.  2018 Elsevier/Gold Standard (2016-07-19 14:14:56)

## 2017-03-07 ENCOUNTER — Encounter: Payer: Self-pay | Admitting: Internal Medicine

## 2017-03-07 LAB — BASIC METABOLIC PANEL
BUN/Creatinine Ratio: 15 (ref 9–23)
BUN: 14 mg/dL (ref 6–24)
CHLORIDE: 107 mmol/L — AB (ref 96–106)
CO2: 23 mmol/L (ref 18–29)
Calcium: 9 mg/dL (ref 8.7–10.2)
Creatinine, Ser: 0.92 mg/dL (ref 0.57–1.00)
GFR calc Af Amer: 86 mL/min/{1.73_m2} (ref >59)
GFR, EST NON AFRICAN AMERICAN: 74 mL/min/{1.73_m2} (ref >59)
Glucose: 95 mg/dL (ref 65–99)
POTASSIUM: 4.4 mmol/L (ref 3.5–5.2)
Sodium: 144 mmol/L (ref 134–144)

## 2017-03-07 LAB — CBC
Hematocrit: 37.3 % (ref 34.0–46.6)
Hemoglobin: 12.1 g/dL (ref 11.1–15.9)
MCH: 29.7 pg (ref 26.6–33.0)
MCHC: 32.4 g/dL (ref 31.5–35.7)
MCV: 91 fL (ref 79–97)
Platelets: 338 10*3/uL (ref 150–379)
RBC: 4.08 x10E6/uL (ref 3.77–5.28)
RDW: 13.3 % (ref 12.3–15.4)
WBC: 3.9 10*3/uL (ref 3.4–10.8)

## 2017-03-07 LAB — LIPID PANEL
CHOLESTEROL TOTAL: 177 mg/dL (ref 100–199)
Chol/HDL Ratio: 2.9 ratio (ref 0.0–4.4)
HDL: 61 mg/dL (ref >39)
LDL CALC: 108 mg/dL — AB (ref 0–99)
Triglycerides: 42 mg/dL (ref 0–149)
VLDL Cholesterol Cal: 8 mg/dL (ref 5–40)

## 2017-03-07 LAB — TSH: TSH: 1.82 u[IU]/mL (ref 0.450–4.500)

## 2017-04-04 ENCOUNTER — Encounter: Payer: Self-pay | Admitting: Internal Medicine

## 2017-04-10 ENCOUNTER — Telehealth: Payer: Self-pay | Admitting: *Deleted

## 2017-04-10 DIAGNOSIS — G932 Benign intracranial hypertension: Secondary | ICD-10-CM

## 2017-04-10 DIAGNOSIS — H4710 Unspecified papilledema: Secondary | ICD-10-CM

## 2017-04-10 MED ORDER — ACETAZOLAMIDE 250 MG PO TABS: 250.0000 mg | ORAL_TABLET | Freq: Two times a day (BID) | ORAL | 1 refills | Status: DC

## 2017-04-10 NOTE — Addendum Note (Signed)
Addended by: [place] PinkHESSON, Charlean Carneal C on: 04/10/2017 04:08 PM   Modules accepted: Orders

## 2017-04-10 NOTE — Telephone Encounter (Signed)
Rx sent to [facility] at patient's request, disp for 90 day supply with 1 refill.

## 2017-04-10 NOTE — Telephone Encounter (Signed)
Patient called office in reference to acetazolamid per patient she not longer uses the [facility] medication was originally sent to now using [facility]ill came from.

## 2017-04-10 NOTE — Telephone Encounter (Signed)
LVM requesting call back re: fax received for refill of acet[facility]amid from [facility]. [facility]

## 2017-07-09 ENCOUNTER — Encounter: Payer: Self-pay | Admitting: Family Medicine

## 2017-07-09 ENCOUNTER — Ambulatory Visit (INDEPENDENT_AMBULATORY_CARE_PROVIDER_SITE_OTHER): Payer: Managed Care, Other (non HMO) | Admitting: Family Medicine

## 2017-07-09 VITALS — BP 98/60 | HR 69 | Temp 97.7°F | Wt 164.0 lb

## 2017-07-09 DIAGNOSIS — N951 Menopausal and female climacteric states: Secondary | ICD-10-CM | POA: Diagnosis not present

## 2017-07-09 DIAGNOSIS — Z3009 Encounter for other general counseling and advice on contraception: Secondary | ICD-10-CM

## 2017-07-09 LAB — POCT URINE PREGNANCY: PREG TEST UR: NEGATIVE

## 2017-07-09 MED ORDER — NORETHINDRONE-ETH ESTRADIOL 1-35 MG-MCG PO TABS: 1.0000 | ORAL_TABLET | Freq: Every day | ORAL | 11 refills | Status: DC

## 2017-07-09 NOTE — Progress Notes (Signed)
    Subjective:    Patient ID: Joy Gentry, female    DOB: 01-26-1971, 46 y.o.   MRN: [id number]  CC: discuss BC  Was considering IUD but now unsure and may want to do OCP's instead. She was on OCPs in the past and tolerated them very well, had no issue remembering to take them. She would prefer not to have an implant in her body. Her only concern is that she has been having irregular periods for the past several years and is unsure if she is menopausal or not. Her mother went through menopause in her early fifties. Patient had her period 2 months ago but prior to that did not have a period for 2 years. She's happy to not have a period but is just concerned about getting pregnant. She is getting remarried next year. No other concerns or issues. Has never had a blood clot. Does not smoke.  Smoking status reviewed- never smoker  Review of Systems- no CP, SOB, fatigue, headache, blurred vision, vaginal bleeding, vaginal discharge, pelvic pain   Objective:  BP 98/60   Pulse 69   Temp 97.7 F (36.5 C) (Oral)   Wt 164 lb (74.4 kg)   BMI 28.15 kg/m  Vitals and nursing note reviewed  General: well nourished, in no acute distress Cardiac: RRR, clear S1 and S2, no murmurs, rubs, or gallops Respiratory: clear to auscultation bilaterally, no increased work of breathing Extremities: no edema or cyanosis Skin: warm and dry, no rashes noted Neuro: alert and oriented, no focal deficits  Assessment & Plan:    Birth control counseling  Patient decided against IUD today and would prefer to go back on the OCP she was on years ago.   -u preg today negative -rx given for ortho-novum tablets -will check FSH/LH today- if patient clearly menopausal by these labs will inform her no need for OCPs, if intermediate plan to continue OCPs for next 1-2 years and reassess afterward -follow up with PCP as needed     Return as needed with PCP.   Dolores Patty, DO Family Medicine Resident  PGY-2

## 2017-07-09 NOTE — Assessment & Plan Note (Signed)
  Patient decided against IUD today and would prefer to go back on the OCP she was on years ago.   -u preg today negative -rx given for ortho-novum tablets -will check FSH/LH today- if patient clearly menopausal by these labs will inform her no need for OCPs, if intermediate plan to continue OCPs for next 1-2 years and reassess afterward -follow up with PCP as needed

## 2017-07-09 NOTE — Patient Instructions (Signed)
It was great seeing you today!  I will give you a call with your lab results.   If you have questions or concerns please do not hesitate to call at [phone].  Dolores Patty, DO PGY-2, [facility] 07/09/2017 10:47 AM    Perimenopause Perimenopause is the time when your body begins to move into the menopause (no menstrual period for 12 straight months). It is a natural process. Perimenopause can begin 2-8 years before the menopause and usually lasts for 1 year after the menopause. During this time, your ovaries may or may not produce an egg. The ovaries vary in their production of estrogen and progesterone hormones each month. This can cause irregular menstrual periods, difficulty getting pregnant, vaginal bleeding between periods, and uncomfortable symptoms. What are the causes?  Irregular production of the ovarian hormones, estrogen and progesterone, and not ovulating every month. Other causes include:  Tumor of the pituitary gland in the brain.  Medical disease that affects the ovaries.  Radiation treatment.  Chemotherapy.  Unknown causes.  Heavy smoking and excessive alcohol intake can bring on perimenopause sooner.  What are the signs or symptoms?  Hot flashes.  Night sweats.  Irregular menstrual periods.  Decreased sex drive.  Vaginal dryness.  Headaches.  Mood swings.  Depression.  Memory problems.  Irritability.  Tiredness.  Weight gain.  Trouble getting pregnant.  The beginning of losing bone cells (osteoporosis).  The beginning of hardening of the arteries (atherosclerosis). How is this diagnosed? Your health care provider will make a diagnosis by analyzing your age, menstrual history, and symptoms. He or she will do a physical exam and note any changes in your body, especially your female organs. Female hormone tests may or may not be helpful depending on the amount of female hormones you produce and when you produce them.  However, other hormone tests may be helpful to rule out other problems. How is this treated? In some cases, no treatment is needed. The decision on whether treatment is necessary during the perimenopause should be made by you and your health care provider based on how the symptoms are affecting you and your lifestyle. Various treatments are available, such as:  Treating individual symptoms with a specific medicine for that symptom.  Herbal medicines that can help specific symptoms.  Counseling.  Group therapy.  Follow these instructions at home:  Keep track of your menstrual periods (when they occur, how heavy they are, how long between periods, and how long they last) as well as your symptoms and when they started.  Only take over-the-counter or prescription medicines as directed by your health care provider.  Sleep and rest.  Exercise.  Eat a diet that contains calcium (good for your bones) and soy (acts like the estrogen hormone).  Do not smoke.  Avoid alcoholic beverages.  Take vitamin supplements as recommended by your health care provider. Taking vitamin E may help in certain cases.  Take calcium and vitamin D supplements to help prevent bone loss.  Group therapy is sometimes helpful.  Acupuncture may help in some cases. Contact a health care provider if:  You have questions about any symptoms you are having.  You need a referral to a specialist (gynecologist, psychiatrist, or psychologist). Get help right away if:  You have vaginal bleeding.  Your period lasts longer than 8 days.  Your periods are recurring sooner than 21 days.  You have bleeding after intercourse.  You have severe depression.  You have pain when you urinate.  You have severe headaches.  You have vision problems. This information is not intended to replace advice given to you by your health care provider. Make sure you discuss any questions you have with your health care  provider. Document Released: 11/14/2004 Document Revised: 03/14/2016 Document Reviewed: 05/06/2013 Elsevier Interactive Patient Education  2017 [employer].

## 2017-07-10 LAB — FSH/LH
FSH: 99.4 m[IU]/mL
LH: 52.5 m[IU]/mL

## 2017-07-16 ENCOUNTER — Telehealth: Payer: Self-pay

## 2017-07-16 NOTE — Telephone Encounter (Signed)
-----   Message from Tillman Sers, DO sent at 07/14/2017 11:49 AM EDT ----- Attempted to call back patient with results however both numbers listed unable to leave messages. FSH elevated consistent with perimenopause however would advise patient to continue OCP's over the next year to avoid pregnancy. Would reassess in future off OCP's to confirm menopause if patient without period >1 year off OCP's. Will continue to try to reach patient. Discussed with PCP.

## 2017-07-16 NOTE — Telephone Encounter (Signed)
Attempted to reach pt to discuss, pt did not answer, Vm left for pt asking for a return call.

## 2017-07-17 NOTE — Telephone Encounter (Signed)
Patient left message on nurse line requesting detailed message with lab results since she will be going to work. Kinnie Feil, RN, BSN

## 2017-07-18 NOTE — Telephone Encounter (Signed)
Pt contacted and informed of plan. Pt is going to start he OCPs on Sunday. Pt was appreciative of the call and will FU as needed.

## 2017-08-18 ENCOUNTER — Ambulatory Visit: Payer: 59 | Admitting: Neurology

## 2017-09-03 ENCOUNTER — Ambulatory Visit: Payer: 59 | Admitting: Neurology

## 2017-09-17 ENCOUNTER — Ambulatory Visit: Payer: 59 | Admitting: Neurology

## 2017-10-01 ENCOUNTER — Other Ambulatory Visit: Payer: Self-pay

## 2017-10-01 ENCOUNTER — Encounter: Payer: Self-pay | Admitting: Family Medicine

## 2017-10-01 ENCOUNTER — Telehealth: Payer: Self-pay | Admitting: Internal Medicine

## 2017-10-01 ENCOUNTER — Ambulatory Visit (INDEPENDENT_AMBULATORY_CARE_PROVIDER_SITE_OTHER): Payer: Managed Care, Other (non HMO) | Admitting: Family Medicine

## 2017-10-01 VITALS — BP 112/60 | HR 81 | Temp 98.4°F | Ht 64.0 in | Wt 167.0 lb

## 2017-10-01 DIAGNOSIS — R05 Cough: Secondary | ICD-10-CM

## 2017-10-01 DIAGNOSIS — Z3009 Encounter for other general counseling and advice on contraception: Secondary | ICD-10-CM

## 2017-10-01 DIAGNOSIS — R059 Cough, unspecified: Secondary | ICD-10-CM

## 2017-10-01 MED ORDER — AZITHROMYCIN 250 MG PO TABS: 250.0000 mg | ORAL_TABLET | Freq: Every day | ORAL | 0 refills | Status: DC

## 2017-10-01 NOTE — Patient Instructions (Signed)
Cough, Adult Coughing is a reflex that clears your throat and your airways. Coughing helps to heal and protect your lungs. It is normal to cough occasionally, but a cough that happens with other symptoms or lasts a long time may be a sign of a condition that needs treatment. A cough may last only 2-3 weeks (acute), or it may last longer than 8 weeks (chronic). What are the causes? Coughing is commonly caused by:  Breathing in substances that irritate your lungs.  A viral or bacterial respiratory infection.  Allergies.  Asthma.  Postnasal drip.  Smoking.  Acid backing up from the stomach into the esophagus (gastroesophageal reflux).  Certain medicines.  Chronic lung problems, including COPD (or rarely, lung cancer).  Other medical conditions such as heart failure.  Follow these instructions at home: Pay attention to any changes in your symptoms. Take these actions to help with your discomfort:  Take medicines only as told by your health care provider. ? If you were prescribed an antibiotic medicine, take it as told by your health care provider. Do not stop taking the antibiotic even if you start to feel better. ? Talk with your health care provider before you take a cough suppressant medicine.  Drink enough fluid to keep your urine clear or pale yellow.  If the air is dry, use a cold steam vaporizer or humidifier in your bedroom or your home to help loosen secretions.  Avoid anything that causes you to cough at work or at home.  If your cough is worse at night, try sleeping in a semi-upright position.  Avoid cigarette smoke. If you smoke, quit smoking. If you need help quitting, ask your health care provider.  Avoid caffeine.  Avoid alcohol.  Rest as needed.  Contact a health care provider if:  You have new symptoms.  You cough up pus.  Your cough does not get better after 2-3 weeks, or your cough gets worse.  You cannot control your cough with suppressant  medicines and you are losing sleep.  You develop pain that is getting worse or pain that is not controlled with pain medicines.  You have a fever.  You have unexplained weight loss.  You have night sweats. Get help right away if:  You cough up blood.  You have difficulty breathing.  Your heartbeat is very fast. This information is not intended to replace advice given to you by your health care provider. Make sure you discuss any questions you have with your health care provider. Document Released: 04/05/2011 Document Revised: 03/14/2016 Document Reviewed: 12/14/2014 Elsevier Interactive Patient Education  2017 Elsevier Inc.  

## 2017-10-01 NOTE — Assessment & Plan Note (Signed)
Given age, would prefer no OC's. Also, patient does not desire monthly cycles. FSH is indicative of menopause, but has not been 12 months without a cycle. Good candidate for IUD. She may return for IUD placement.

## 2017-10-01 NOTE — Progress Notes (Signed)
   Subjective:    Patient ID: Joy Gentry is a 46 y.o. female presenting with Cough  on 10/01/2017  HPI: 6 wk h/o URI plus cough 6 wks ago. Still having some cough. She is reporting some productive cough. Cough is worse when talking on the phone. Having spells that are worse at night. Was seen in Urgent Care and given something for cough and that improved it a little but the cough has been persistent.   Also wants to talk about her cycles. Have been intermittent. Last one 3 months ago, then 8 months prior to that. Most recent FSH is 99. Placed on OC's though she desired IUD.  Review of Systems  Constitutional: Negative for chills and fever.  Respiratory: Negative for shortness of breath.   Cardiovascular: Negative for chest pain.  Gastrointestinal: Negative for abdominal pain, nausea and vomiting.  Genitourinary: Negative for dysuria.  Skin: Negative for rash.      Objective:    BP 112/60   Pulse 81   Temp 98.4 F (36.9 C) (Oral)   Ht 5\' 4"  (1.626 m)   Wt 167 lb (75.8 kg)   SpO2 99%   BMI 28.67 kg/m  Physical Exam  Constitutional: She is oriented to person, place, and time. She appears well-developed and well-nourished. No distress.  HENT:  Head: Normocephalic and atraumatic.  Eyes: No scleral icterus.  Neck: Neck supple.  Cardiovascular: Normal rate.  Pulmonary/Chest: Effort normal. She has no wheezes. She has no rales.  Clear to percussion  Abdominal: Soft.  Musculoskeletal: Normal range of motion. She exhibits no edema.  Neurological: She is alert and oriented to person, place, and time.  Skin: Skin is warm and dry.  Psychiatric: She has a normal mood and affect.       Assessment & Plan:   Problem List Items Addressed This Visit      Unprioritized   Birth control counseling    Given age, would prefer no OC's. Also, patient does not desire monthly cycles. FSH is indicative of menopause, but has not been 12 months without a cycle. Good candidate for  IUD. She may return for IUD placement.       Other Visit Diagnoses    Cough    -  Primary   ? pertussis or walking pneumonia given time of cough--will treat empirically   Relevant Medications   azithromycin (ZITHROMAX) 250 MG tablet      Total face-to-face time with patient: 15 minutes. Over 50% of encounter was spent on counseling and coordination of care.  Reva Boresanya S Gradie Butrick 10/01/2017 2:33 PM

## 2017-10-29 ENCOUNTER — Ambulatory Visit (INDEPENDENT_AMBULATORY_CARE_PROVIDER_SITE_OTHER): Payer: Managed Care, Other (non HMO) | Admitting: Neurology

## 2017-10-29 ENCOUNTER — Encounter: Payer: Self-pay | Admitting: Neurology

## 2017-10-29 ENCOUNTER — Telehealth: Payer: Self-pay | Admitting: Neurology

## 2017-10-29 VITALS — BP 98/66 | HR 68 | Wt 167.0 lb

## 2017-10-29 DIAGNOSIS — Z8669 Personal history of other diseases of the nervous system and sense organs: Secondary | ICD-10-CM

## 2017-10-29 DIAGNOSIS — G932 Benign intracranial hypertension: Secondary | ICD-10-CM | POA: Diagnosis not present

## 2017-10-29 DIAGNOSIS — H4710 Unspecified papilledema: Secondary | ICD-10-CM

## 2017-10-29 NOTE — Progress Notes (Signed)
NEUROLOGY CLINIC NEW PATIENT NOTE  NAME: Joy Gentry DOB: 1971-09-13 REFERRING PHYSICIAN: Billie Ruddy, O.D   HPI: Joy Gentry is a 47 y.o. 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   She stated that for the last several years she started to feel pressure-like feeling in the back of her head and upper neck, this feeling comes and goes, happens every week lasting hours. For the last 1 year also she started to have episodic vision changes, especially in the morning, with vision suddenly goes greyish for several seconds. It happens once or twice a months. She denies any frank headache, patient loss, nausea vomiting, loss of consciousness. She admitted that she normally had photophobia and phonophobia, which has been for her long time. She went to see her ophthalmologist Dr. Sherryle Lis for annual check 2 weeks ago, was found to have 3+ bilateral papilledema, concerning for pseudotumor cerebri, and referred her for neurology consult.  She stated that about 27 years ago she had headache, mild right arm weakness, and mild problem with coordination. 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 However she cannot remember what the medical term of that condition. She stated that after surgery or her symptoms were gone.  She denies any history of migraine, hypertension, diabetes, hyperlipidemia. She stated that she gained 10 pounds this year, and now she is 170 pounds. She also admit that she also bathroom a lot.  She denies smoking, drinking or illicit drugs. She works in [employer], stressful job, she also experiences recent years of anxiety due to being single mom, insurance problems among others. She stated that recently she had difficulty with her jobs as she has been the  customer service specialist for long time, but recently not able to concentrate during the phone call with customers.  02/05/17 follow up - pt had LP on 06/13/15 and OP was 20cm H2O and CP was 3cm H2O. CSF unremarkable except WBC 11. She is asymptomatic clinically, no concern for CNS infection. CSF culture negative. She was put on diamox and titrated up to 500mg  bid. Complaint of drowsiness during the day, concerning for side effect from Diamox and put her on 500 mg Diamox at night and decreased morning dose of Diamox from 500 to 250. Tolerating well. She moved to [place], [place] in 09/2015, but did not follow neurology there. She followed with ophthalmology and continued to see improvement of her fundi exam and recommended to stop diamox due to concern of kidney function with long term use of diamox. However, she does not feel comfortable to be off diamox completely at that time. After discussion, we decreased her dose to 250mg . In 04/2016, she lost her insurance so her diamox was stopped by her own. Although she was asymptomatic, recent eye exam on 01/28/17 showed bilateral disc margin indistinct, and Dr. Laural Benes mentioned "swelling is noticeable", and recommend to re-institute therapy. She denies any HA, vision disturbance. She has new prescription glass to be ready next week.   Interval history: During the interval time, pt has been doing well. No visual disturbance, no HA. She still on diamox 250mg  bid. She followed up with Dr. Sherryle Lis on 09/19/17 and check fundi showed "bilateral disc margin is indistinct, disc size is large, there are breaks in the nerve fiber layer that  do not extend back to the optic nerve margin". But no papilledema mentioned.   Past Medical History:  Diagnosis Date  . Vision abnormalities     Papilledema   Past Surgical History:  Procedure Laterality Date  . neck sugery    . oophrectomy Left    Family History  Problem Relation Age of Onset  . Hypertension Mother   .  Hypertension Father    Current Outpatient Medications  Medication Sig Dispense Refill  . acetaZOLAMIDE (DIAMOX) 250 MG tablet Take 1 tablet (250 mg total) by mouth 2 (two) times daily. 180 tablet 1  . Multiple Vitamin (MULTIVITAMIN WITH MINERALS) TABS Take 1 tablet by mouth daily.    . vitamin B-12 (CYANOCOBALAMIN) 100 MCG tablet Take 100 mcg by mouth daily.     No current facility-administered medications for this visit.    No Known Allergies Social History   Socioeconomic History  . Marital status: Divorced    Spouse name: Not on file  . Number of children: Not on file  . Years of education: Not on file  . Highest education level: Not on file  Social Needs  . Financial resource strain: Not on file  . Food insecurity - worry: Not on file  . Food insecurity - inability: Not on file  . Transportation needs - medical: Not on file  . Transportation needs - non-medical: Not on file  Occupational History  . Not on file  Tobacco Use  . Smoking status: Never Smoker  . Smokeless tobacco: Never Used  Substance and Sexual Activity  . Alcohol use: No  . Drug use: No  . Sexual activity: Yes  Other Topics Concern  . Not on file  Social History Narrative  . Not on file    Review of Systems Full 14 system review of systems performed and notable only for those listed, all others are neg:  Constitutional:   Cardiovascular:  Ear/Nose/Throat:   Skin:  Eyes:   Respiratory:   Gastroitestinal:   Genitourinary: Frequent urination Hematology/Lymphatic:   Endocrine:  Musculoskeletal:   Allergy/Immunology:   Neurological:  dizziness Psychiatric:  Sleep:   Physical Exam  Vitals:   10/29/17 0839  BP: 98/66  Pulse: 68    General - Well nourished, well developed, not in acute distress.  Ophthalmologic - optic disc bilateral large size, disc margin not clear but no papilledema and right eye venous pulsation can be seen.  Cardiovascular - Regular rate and rhythm with no murmur.  Carotid pulses were 2+ without bruits .   Neck - supple, no nuchal rigidity. Surgical scar in the middle of neck.  Mental Status -  Level of arousal and orientation to time, place, and person were intact. Language including expression, naming, repetition, comprehension, reading, and writing was assessed and found intact. Attention span and concentration were normal. Recent and remote memory were intact. Fund of Knowledge was assessed and was intact.  Cranial Nerves II - XII - II - Visual field intact OU. III, IV, VI - Extraocular movements intact. V - Facial sensation intact bilaterally. VII - Facial movement intact bilaterally. VIII - Hearing & vestibular intact bilaterally. X - Palate elevates symmetrically. XI - Chin turning & shoulder shrug intact bilaterally. XII - Tongue protrusion intact.  Motor Strength - The patient's strength was normal in all extremities and pronator drift was absent.  Bulk was normal and fasciculations were absent.   Motor Tone - Muscle tone was assessed at the neck and appendages and was normal.  Reflexes - The patient's reflexes were normal in all extremities and she had no pathological reflexes.  Sensory - symmetrical bilaterally    Coordination - The patient had normal movements in the hands and feet with no ataxia or dysmetria.  Tremor was absent.  Gait and Station - The patient's transfers, posture, gait, station, and turns were observed as normal.   Imaging I have personally reviewed the radiological images below and agree with the radiology interpretations.  MRI brain 06/02/15 1. Sub-occipital decompressive craniectomy. The cerebellar tonsils crowd the posterior fossa and are low lying (11mm) below the foramen magnum. Consistent with patient's history of Chiari malformation status post decompression surgery.  2. Slightly enlarged optic nerve sheaths. Slightly enlarged sella. Findings are nonspecific but can be seen in association with  idiopathic intracranial hypertension.  3. No acute findings. No abnormal enhancing lesions.  MRI C-spine 06/02/15 1. Suboccipital decompressive craniectomy. No evidence of syringomyelia. 2. No intrinsic, compressive or abnormal enhancing spinal cord lesions.  MRV head 06/02/15 1. Decreased flow signal in the posterior aspect of superior sagittal sinus, bilateral sigmoid sinuses, and left transverse sinus.  2. Above findings are nonspecific and may be due to idiopathic intracranial hypertension, normal variant or technical artifact. No definite evidence of focal venous sinus thrombosis.   Fluoro guided LP 06/13/15 opening pressure of 20 cm water. Closing pressure 3 cm H2O.   Lab Review Component     Latest Ref Rng & Units 05/29/2015 06/13/2015          8:33 AM  Tube #       3  Color, CSF     COLORLESS  COLORLESS  Appearance, CSF     CLEAR  CLEAR  Supernatant     COLORLESS  COLORLESS  WBC, CSF     0 - 5 cu mm  11 (H)  RBC Count, CSF     0 cu mm  5 (H)  Segmented Neutrophils-CSF     0 - 6 %  TOO FEW TO COUNT  Lymphs, CSF     40 - 80 %  TOO FEW TO COUNT  Monocyte/Macrophage     15 - 45 %  TOO FEW TO COUNT  Eosinophils, CSF     0 - 1 %  TOO FEW TO COUNT  Gram Stain         Organism ID, Bacteria       No Acid Fast Bacilli Isolated in 6 Weeks  Specimen Source       CSF  Cryptococcal Ag Screen     Not Detected  Not Detected  Additional Testing       REPORT  Acid Fast Smear       No Acid Fast Bacilli Seen  HIV     NONREACTIVE NONREACTIVE   RPR     NON REAC NON REAC   Glucose, CSF     43 - 76 mg/dL  63  Total  Protein, CSF     15 - 45 mg/dL  31   Component     Latest Ref Rng & Units 06/13/2015 06/13/2015         8:33 AM  8:33 AM  Tube #         Color, CSF     COLORLESS    Appearance, CSF     CLEAR    Supernatant     COLORLESS    WBC, CSF     0 - 5 cu mm    RBC  Count, CSF     0 cu mm    Segmented Neutrophils-CSF     0 - 6 %    Lymphs, CSF     40 - 80 %     Monocyte/Macrophage     15 - 45 %    Eosinophils, CSF     0 - 1 %    Gram Stain      CYTOSPIN SLIDE Rare  Organism ID, Bacteria      NO GROWTH 3 DAYS   Specimen Source         Cryptococcal Ag Screen     Not Detected    Additional Testing         Acid Fast Smear         HIV     NONREACTIVE    RPR     NON REAC    Glucose, CSF     43 - 76 mg/dL    Total  Protein, CSF     15 - 45 mg/dL     Component     Latest Ref Rng & Units 06/13/2015         8:33 AM  Tube #        Color, CSF     COLORLESS   Appearance, CSF     CLEAR   Supernatant     COLORLESS   WBC, CSF     0 - 5 cu mm   RBC Count, CSF     0 cu mm   Segmented Neutrophils-CSF     0 - 6 %   Lymphs, CSF     40 - 80 %   Monocyte/Macrophage     15 - 45 %   Eosinophils, CSF     0 - 1 %   Gram Stain      WBC present-predominately Mononuclear  Organism ID, Bacteria        Specimen Source        Cryptococcal Ag Screen     Not Detected   Additional Testing        Acid Fast Smear        HIV     NONREACTIVE   RPR     NON REAC   Glucose, CSF     43 - 76 mg/dL   Total  Protein, CSF     15 - 45 mg/dL    Component     Latest Ref Rng & Units 06/13/2015         8:33 AM  Tube #        Color, CSF     COLORLESS   Appearance, CSF     CLEAR   Supernatant     COLORLESS   WBC, CSF     0 - 5 cu mm   RBC Count, CSF     0 cu mm   Segmented Neutrophils-CSF     0 - 6 %   Lymphs, CSF     40 - 80 %   Monocyte/Macrophage     15 - 45 %   Eosinophils, CSF     0 - 1 %   Gram Stain      No Organisms Seen  Organism ID, Bacteria        Specimen Source        Cryptococcal Ag Screen     Not Detected   Additional Testing        Acid Fast Smear  HIV     NONREACTIVE   RPR     NON REAC   Glucose, CSF     43 - 76 mg/dL   Total  Protein, CSF     15 - 45 mg/dL       Assessment:   In summary, Joy Gentry is a 47 y.o. female with PMH of likely Chiari malformation s/p surgical  decompression presents in 05/2015 with 3+ bilateral papilledema. She does endorse symptoms of pressure at back of head and neck, episodic transient visual loss, especially in the morning, with bilateral papilledema on fundus exam, her condition consistent with chronic intracranial hypertension. Due to surgical intervention for possible Chiari malformation, had MRI brain and C-spine ruled out hydrocephalus and syringomyelia. Had LP on 06/13/15 and OP was 20cm H2O and CP was 3cm H2O. CSF unremarkable except WBC 11. She is asymptomatic clinically, no concern for CNS infection. CSF culture negative. She was put on diamox and titrated up to 500mg  bid. Complaint of drowsiness during the day, Diamox was decreased to 250/500. Tolerating well. She followed with ophthalmology and continued to see improvement of her fundi exam and recommended to stop diamox due to concern of kidney function with long term use of diamox. However, she does not feel comfortable to be off diamox completely at that time. After discussion, we decreased her dose to 250mg  bid. In 04/2016, she lost her insurance so her diamox was stopped by her own. Although she was asymptomatic, recent eye exam on 01/28/17 showed bilateral disc margin indistinct, and Dr. Laural Benes mentioned "swelling is noticeable". Diamox 250mg  bid resumed. She denies any HA, vision disturbance during interval time and repeat eye exam in 08/2017 showed continued large disc size and indistinct disc margin but no papilledema noticed. Given stable asymptomatic clinical picture and fundi exam, will continue diamox at the current dose.   Plan: - continue diamox 250mg  twice a day - clinical picture and fundi exam stable over time, do not think we need to change medications. If symptoms or fundi exam getting worse, may consider increase the night dose at that time. - follow up with eye doctor every 6 months for fundi exam. - follow up with PCP and check renal function periodically, less  concerning for kidney injury with low dose diamox at this time  - follow up in 6 months.  I spent more than 25 minutes of face to face time with the patient. Greater than 50% of time was spent in counseling and coordination of care. We discussed eye exam report, continue diamox and importance to follow up with eye doctor.    No orders of the defined types were placed in this encounter.   No orders of the defined types were placed in this encounter.   Patient Instructions  - continue diamox 250mg  twice a day - clinical picture and fundi exam stable over time, do not think we need to change medications. If symptoms or fundi exam getting worse, may consider increase the night dose at that time. - follow up with eye doctor every 6 months for fundi exam. - follow up with PCP and check renal function periodically, less concerning for kidney injury with low dose diamox at this time  - follow up in 6 months.   Marvel Plan, MD PhD [facility] [address] [place], [place] [address] [phone]

## 2017-10-29 NOTE — Patient Instructions (Addendum)
-   continue diamox 250mg  twice a day - clinical picture and fundi exam stable over time, do not think we need to change medications. If symptoms or fundi exam getting worse, may consider increase the night dose at that time. - follow up with eye doctor every 6 months for fundi exam. - follow up with PCP and check renal function periodically, less concerning for kidney injury with low dose diamox at this time  - follow up in 6 months.

## 2017-10-29 NOTE — Telephone Encounter (Signed)
Pt. States they will CB to schedule 6 month follow-up w/ NP Daphine DeutscherMartin.

## 2017-10-29 NOTE — Telephone Encounter (Signed)
Rn call patient about her 6 month follow up. Rn stated an appt is needed to continue refills long term. PT states she is off every other Wednesday. Pt states she has 5 more refills as of now. Pt will call back before her refills expired to request a new one. Appt made in 6 months. PT knows time and date.

## 2017-12-08 ENCOUNTER — Other Ambulatory Visit: Payer: Self-pay | Admitting: Neurology

## 2017-12-08 DIAGNOSIS — H4710 Unspecified papilledema: Secondary | ICD-10-CM

## 2017-12-08 DIAGNOSIS — G932 Benign intracranial hypertension: Secondary | ICD-10-CM

## 2018-02-04 ENCOUNTER — Other Ambulatory Visit (HOSPITAL_COMMUNITY): Payer: Managed Care, Other (non HMO)

## 2018-02-04 ENCOUNTER — Other Ambulatory Visit: Payer: Self-pay

## 2018-02-04 ENCOUNTER — Encounter: Payer: Self-pay | Admitting: Internal Medicine

## 2018-02-04 ENCOUNTER — Ambulatory Visit (INDEPENDENT_AMBULATORY_CARE_PROVIDER_SITE_OTHER): Payer: Managed Care, Other (non HMO) | Admitting: Internal Medicine

## 2018-02-04 ENCOUNTER — Ambulatory Visit (HOSPITAL_COMMUNITY)
Admission: RE | Admit: 2018-02-04 | Discharge: 2018-02-04 | Disposition: A | Discharge status: Home / Self Care | Payer: Managed Care, Other (non HMO) | Source: Ambulatory Visit | Attending: Family Medicine | Admitting: Family Medicine

## 2018-02-04 VITALS — BP 100/58 | HR 65 | Temp 98.3°F | Wt 167.0 lb

## 2018-02-04 DIAGNOSIS — Z8249 Family history of ischemic heart disease and other diseases of the circulatory system: Secondary | ICD-10-CM | POA: Diagnosis not present

## 2018-02-04 DIAGNOSIS — Z0001 Encounter for general adult medical examination with abnormal findings: Secondary | ICD-10-CM | POA: Diagnosis not present

## 2018-02-04 DIAGNOSIS — E785 Hyperlipidemia, unspecified: Secondary | ICD-10-CM

## 2018-02-04 NOTE — Patient Instructions (Signed)
It was so nice to see you!  Everything looks great today. Your risk of a major heart attack or stroke in the next 10 years is 0.4%, based on your blood pressure, history, and cholesterol levels.  I have repeated your cholesterol panel today and I will call you with those results.  We will see you back in August for your physical and pap.  -Dr. Nancy MarusMayo

## 2018-02-04 NOTE — Assessment & Plan Note (Signed)
Patient with multiple family members that have heart disease, including a female cousin who recently passed away from a massive heart attack at the age of 48. She is having a lot of anxiety about developing heart disease over time. No cardiac symptoms. - Reviewed patient's blood pressure, past lipid panels, and social history. Calculated patient's ASCVD risk score in the room and it was 0.4%. Reassurance provided. - Will recheck lipid panel - Encouraged limiting fried foods, eating plenty of fruits and vegetables, and exercising for 30 minutes five times per week - Patient very adamant about wanting an EKG today. She called her insurance company and they will cover it. EKG ordered and was completely normal today.  - Follow-up in 4-6 months for well woman exam

## 2018-02-04 NOTE — Assessment & Plan Note (Signed)
Last lipid panel 02/2017 with chol 177, HDL 61, LDL 108, TG 42. - Check lipid panel

## 2018-02-04 NOTE — Progress Notes (Signed)
   [facility] Phone: [phone]  Subjective:  Joy Gentry is a 47 year old female presenting to clinic with concerns about her family history of heart problems. She states she has many family members on her mother's side that have had heart issues. Her mom has a heart murmur. Her maternal aunt has a pacemaker. Her maternal cousin just recently died of a massive heart attack right before he turned 5949. She also has two maternal uncles that have heart disease. She would like to know what she can do to decrease her risk of developing heart disease. She exercises regularly and tries to eat healthy. She does eat a lot of fried food. She denies any chest pain, shortness of breath, lower extremity edema, or palpitations. She would like to have an EKG done today because she called her insurance company and they told her that they would cover an EKG fully.  ROS: See HPI for pertinent positives and negatives  Past Medical History- pseudotumor cerebri with history of chiari malformation, anxiety  Family history- see HPI  Social history- patient is a never smoker  Objective: BP (!) 100/58   Pulse 65   Temp 98.3 F (36.8 C) (Oral)   Wt 167 lb (75.8 kg)   SpO2 98%   BMI 28.67 kg/m  Gen: NAD, alert, cooperative with exam HEENT: NCAT, EOMI, MMM Neck: FROM, supple CV: RRR, no murmur Resp: CTABL, no wheezes, normal work of breathing Msk: No edema, warm, normal tone, moves UE/LE spontaneously Neuro: Alert and oriented, no gross deficits Skin: No rashes, no lesions Psych: Appropriate behavior  Assessment/Plan: Family History of Heart Disease: Patient with multiple family members that have heart disease, including a female cousin who recently passed away from a massive heart attack at the age of 48. She is having a lot of anxiety about developing heart disease over time. No cardiac symptoms. - Reviewed patient's blood pressure, past lipid panels, and social history. Calculated  patient's ASCVD risk score in the room and it was 0.4%. Reassurance provided. - Will recheck lipid panel - Encouraged limiting fried foods, eating plenty of fruits and vegetables, and exercising for 30 minutes five times per week - Patient very adamant about wanting an EKG today. She called her insurance company and they will cover it. EKG ordered and was completely normal today.  - Follow-up in 4-6 months for well woman exam  HLD: Last lipid panel 02/2017 with chol 177, HDL 61, LDL 108, TG 42. - Check lipid panel   Willadean CarolKaty Hazyl Marseille, MD PGY-3

## 2018-04-27 NOTE — Progress Notes (Signed)
[facility]  PATIENT: Joy Gentry DOB: [birth year]   REASON FOR VISIT: Follow-up for pseudotumor, intracranial hypertension HISTORY FROM: Patient    HISTORY OF PRESENT ILLNESS: Joy Gentry is a 47 y.o. 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   She stated that for the last several years she started to feel pressure-like feeling in the back of her head and upper neck, this feeling comes and goes, happens every week lasting hours. For the last 1 year also she started to have episodic vision changes, especially in the morning, with vision suddenly goes greyish for several seconds. It happens once or twice a months. She denies any frank headache, patient loss, nausea vomiting, loss of consciousness. She admitted that she normally had photophobia and phonophobia, which has been for her long time. She went to see her ophthalmologist Dr. Sherryle Lis for annual check 2 weeks ago, was found to have 3+ bilateral papilledema, concerning for pseudotumor cerebri, and referred her for neurology consult.  She stated that about 27 years ago she had headache, mild right arm weakness, and mild problem with coordination. 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 However she cannot remember what the medical term of that condition. She stated that after surgery or her symptoms were gone.  She denies any history of migraine, hypertension, diabetes, hyperlipidemia. She stated that she gained 10 pounds this year, and now she is 170 pounds. She also admit that she also bathroom a lot.  She denies smoking, drinking or illicit drugs. She works in [employer], stressful job, she also experiences recent years of anxiety due to being single mom, insurance problems among  others. 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  02/05/17 follow up - pt had LP on 06/13/15 and OP was 20cm H2O and CP was 3cm H2O. CSF unremarkable except WBC 11. She is asymptomatic clinically, no concern for CNS infection. CSF culture negative. She was put on diamox and titrated up to 500mg  bid. Complaint of drowsiness during the day, concerning for side effect from Diamox and put her on 500 mg Diamox at night and decreased morning dose of Diamox from 500 to 250. Tolerating well. She moved to [place], [place] in 09/2015, but did not follow neurology there. She followed with ophthalmology and continued to see improvement of her fundi exam and recommended to stop diamox due to concern of kidney function with long term use of diamox. However, she does not feel comfortable to be off diamox completely at that time. After discussion, we decreased her dose to 250mg . In 04/2016, she lost her insurance so her diamox was stopped by her own. Although she was asymptomatic, recent eye exam on 01/28/17 showed bilateral disc margin indistinct, and Dr. Laural Benes mentioned "swelling is noticeable", and recommend to re-institute therapy. She denies any HA, vision disturbance. She has new prescription glass to be ready next week.   Interval history:11/08/17 Dr. Roda Shutters During the interval time, pt has been doing well. No visual disturbance, no HA. She still on diamox 250mg  bid. She followed up with Dr. Sherryle Lis on 09/19/17 and check fundi showed "bilateral disc margin is indistinct, disc  size is large, there are breaks in the nerve fiber layer that do not extend back to the optic nerve margin". But no papilledema mentioned.  UPDATE 7/10/2019CM [name], 47 year old female returns for follow-up with history of papilledema.  She is currently on Diamox 250 twice daily.  She had eye exam  today at Dr. Henriette Combs.  Visual fields unchanged and stable.  She will follow-up yearly for visual field testing.  She denies any headache or visual disturbance.  She is due for routine labs through primary care.  She returns for reevaluation   REVIEW OF SYSTEMS: Full 14 system review of systems performed and notable only for those listed, all others are neg:  Constitutional: neg  Cardiovascular: neg Ear/Nose/Throat: neg  Skin: neg Eyes: neg Respiratory: neg Gastroitestinal: neg  Hematology/Lymphatic: neg  Endocrine: neg Musculoskeletal:neg Allergy/Immunology: neg Neurological: neg Psychiatric: neg Sleep : neg   ALLERGIES: No Known Allergies  HOME MEDICATIONS: Outpatient Medications Prior to Visit  Medication Sig Dispense Refill  . acetaZOLAMIDE (DIAMOX) 250 MG tablet TAKE 1 TABLET BY MOUTH TWICE A DAY 180 tablet 0  . Prenatal Multivit-Min-Fe-FA (PRENATAL VITAMINS PO) Take by mouth.    . Multiple Vitamin (MULTIVITAMIN WITH MINERALS) TABS Take 1 tablet by mouth daily.    . vitamin B-12 (CYANOCOBALAMIN) 100 MCG tablet Take 100 mcg by mouth daily.     No facility-administered medications prior to visit.     PAST MEDICAL HISTORY: Past Medical History:  Diagnosis Date  . Vision abnormalities     Papilledema    PAST SURGICAL HISTORY: Past Surgical History:  Procedure Laterality Date  . neck sugery    . oophrectomy Left     FAMILY HISTORY: Family History  Problem Relation Age of Onset  . Hypertension Mother   . Hypertension Father     SOCIAL HISTORY: Social History   Socioeconomic History  . Marital status: Divorced    Spouse name: Not on file  . Number of children: Not on file  . Years of education: Not on file  . Highest education level: Not on file  Occupational History  . Not on file  Social Needs  . Financial resource strain: Not on file  . Food insecurity:    Worry: Not on file    Inability: Not on file  . Transportation needs:    Medical: Not on  file    Non-medical: Not on file  Tobacco Use  . Smoking status: Never Smoker  . Smokeless tobacco: Never Used  Substance and Sexual Activity  . Alcohol use: No  . Drug use: No  . Sexual activity: Yes  Lifestyle  . Physical activity:    Days per week: Not on file    Minutes per session: Not on file  . Stress: Not on file  Relationships  . Social connections:    Talks on phone: Not on file    Gets together: Not on file    Attends religious service: Not on file    Active member of club or organization: Not on file    Attends meetings of clubs or organizations: Not on file    Relationship status: Not on file  . Intimate partner violence:    Fear of current or ex partner: Not on file    Emotionally abused: Not on file    Physically abused: Not on file    Forced sexual activity: Not on file  Other Topics Concern  . Not on file  Social History Narrative  . Not  on file     PHYSICAL EXAM  Vitals:   04/29/18 1311  BP: (!) 88/54  Pulse: (!) 58  Weight: 163 lb (73.9 kg)  Height: 5\' 4"  (1.626 m)   Body mass index is 27.98 kg/m.  Generalized: Well developed, in no acute distress  Head: normocephalic and atraumatic,. Oropharynx benign  Neck: Supple, surgical scar in the middle of the neck Musculoskeletal: No deformity   Neurological examination   Mentation: Alert oriented to time, place, history taking. Attention span and concentration appropriate. Recent and remote memory intact.  Follows all commands speech and language fluent.   Cranial nerve II-XII: Fundoscopic exam reveals indistinct margin no papilledema Pupils were equal round reactive to light extraocular movements were full, visual field were full on confrontational test. Facial sensation and strength were normal. hearing was intact to finger rubbing bilaterally. Uvula tongue midline. head turning and shoulder shrug were normal and symmetric.Tongue protrusion into cheek strength was normal. Motor: normal bulk and  tone, full strength in the BUE, BLE, fine finger movements normal, no pronator drift. No focal weakness Sensory: normal and symmetric to light touch,  Coordination: finger-nose-finger, heel-to-shin bilaterally, no dysmetria Reflexes: Brachioradialis 2/2, biceps 2/2, triceps 2/2, patellar 2/2, Achilles 2/2, plantar responses were flexor bilaterally. Gait and Station: Rising up from seated position without assistance, normal stance,  moderate stride, good arm swing, smooth turning, able to perform tiptoe, and heel walking without difficulty. Tandem gait is steady  DIAGNOSTIC DATA (LABS, IMAGING, TESTING) - I reviewed patient records, labs, notes, testing and imaging myself where available.  Lab Results  Component Value Date   WBC 3.9 03/06/2017   HGB 12.1 03/06/2017   HCT 37.3 03/06/2017   MCV 91 03/06/2017   PLT 338 03/06/2017      Component Value Date/Time   NA 144 03/06/2017 0947   K 4.4 03/06/2017 0947   CL 107 (H) 03/06/2017 0947   CO2 23 03/06/2017 0947   GLUCOSE 95 03/06/2017 0947   GLUCOSE 70 05/29/2015 1513   BUN 14 03/06/2017 0947   CREATININE 0.92 03/06/2017 0947   CREATININE 0.80 05/29/2015 1513   CALCIUM 9.0 03/06/2017 0947   PROT 7.5 05/29/2015 1513   ALBUMIN 4.1 05/29/2015 1513   AST 17 05/29/2015 1513   ALT 16 05/29/2015 1513   ALKPHOS 52 05/29/2015 1513   BILITOT 0.8 05/29/2015 1513   GFRNONAA 74 03/06/2017 0947   GFRAA 86 03/06/2017 0947   Lab Results  Component Value Date   CHOL 177 03/06/2017   HDL 61 03/06/2017   LDLCALC 108 (H) 03/06/2017   TRIG 42 03/06/2017   CHOLHDL 2.9 03/06/2017   Lab Results  Component Value Date   HGBA1C 5.2 03/06/2017   No results found for: VITAMINB12 Lab Results  Component Value Date   TSH 1.820 03/06/2017     ASSESSMENT AND PLAN Dorris SinghJacqueline Y Devoss is a 47 y.o. female with PMH of likely Chiari malformation s/p surgical decompression presents in 05/2015 with 3+ bilateral papilledema. She does endorse symptoms of  pressure at back of head and neck, episodic transient visual loss, especially in the morning, with bilateral papilledema on fundus exam, her condition consistent with chronic intracranial hypertension. Due to surgical intervention for possible Chiari malformation, had MRI brain and C-spine ruled out hydrocephalus and syringomyelia. Had LP on 06/13/15 and OP was 20cm H2O and CP was 3cm H2O. CSF unremarkable except WBC 11. She is asymptomatic clinically, no concern for CNS infection. CSF culture negative. She was put on  diamox and titrated up to 500mg  bid. Complaint of drowsiness during the day, Diamox was decreased to 250 BID. Tolerating well. She followed with ophthalmology and continued to see improvement of her fundi exam In 04/2016, she lost her insurance so her diamox was stopped by her own. Although she was asymptomatic, recent eye exam on 01/28/17 showed bilateral disc margin indistinct, and Dr. Laural Benes mentioned "swelling is noticeable". Diamox 250mg  bid resumed. She denies any HA, vision disturbance during interval time and repeat eye exam in 08/2017 and 04/29/18 showed continued large disc size and indistinct disc margin but no papilledema noticed.  Plan: - continue diamox 250mg  twice a day - clinical picture and fundi exam stable over time,  - follow up with eye doctor every year for fundi exam. - follow up with PCP for routine labs  Follow up 1 year Call for visual disturbance or headache  Nilda Riggs, [facility], [facility], APRN  [facility] [address], [address] [place], [place] [address] [phone]

## 2018-04-29 ENCOUNTER — Ambulatory Visit (INDEPENDENT_AMBULATORY_CARE_PROVIDER_SITE_OTHER): Payer: Managed Care, Other (non HMO) | Admitting: Nurse Practitioner

## 2018-04-29 ENCOUNTER — Encounter: Payer: Self-pay | Admitting: Nurse Practitioner

## 2018-04-29 ENCOUNTER — Telehealth: Payer: Self-pay | Admitting: Nurse Practitioner

## 2018-04-29 VITALS — BP 88/54 | HR 58 | Ht 64.0 in | Wt 163.0 lb

## 2018-04-29 DIAGNOSIS — G932 Benign intracranial hypertension: Secondary | ICD-10-CM | POA: Diagnosis not present

## 2018-04-29 DIAGNOSIS — H4710 Unspecified papilledema: Secondary | ICD-10-CM

## 2018-04-29 MED ORDER — ACETAZOLAMIDE 250 MG PO TABS: 250.0000 mg | ORAL_TABLET | Freq: Two times a day (BID) | ORAL | 3 refills | Status: DC

## 2018-04-29 NOTE — Telephone Encounter (Signed)
Patient was seen today and was given a 1 year follow up to schedule. Patient stated at check-out that she will call to schedule this appt.

## 2018-04-29 NOTE — Telephone Encounter (Signed)
Pt called to advise she has an eye appt today @ 10, he will be faxing the report afterwards to [phone]. Pt has appt with Eber Jonesarolyn today.

## 2018-04-29 NOTE — Patient Instructions (Signed)
continue diamox 250mg  twice a day - clinical picture and fundi exam stable over time,  - follow up with eye doctor every year for fundi exam. - follow up with PCP for routine labs  Follow up 1 year

## 2018-04-29 NOTE — Telephone Encounter (Signed)
Noted, will watch for fax to arrive.

## 2018-10-29 ENCOUNTER — Other Ambulatory Visit: Payer: Self-pay

## 2018-10-29 DIAGNOSIS — H4710 Unspecified papilledema: Secondary | ICD-10-CM

## 2018-10-29 DIAGNOSIS — G932 Benign intracranial hypertension: Secondary | ICD-10-CM

## 2018-10-29 MED ORDER — ACETAZOLAMIDE 250 MG PO TABS: 250.0000 mg | ORAL_TABLET | Freq: Two times a day (BID) | ORAL | 0 refills | Status: DC

## 2018-10-29 MED ORDER — ACETAZOLAMIDE 250 MG PO TABS: 250.0000 mg | ORAL_TABLET | Freq: Two times a day (BID) | ORAL | 1 refills | Status: DC

## 2018-10-29 NOTE — Telephone Encounter (Signed)
Called patient and informed her of refill sent in x 6 months. I told her she needs a follow up on file. She stated her work schedule comes out monthly, and she can only come on a Wed once a month due to work. I advised her to call in June as soon as she gets her schedule so she can schedule a FU. I advised her for the provider to continue to refill her medication, she must be seen at least yearly. She verbalized understanding, appreciation.

## 2018-10-29 NOTE — Telephone Encounter (Signed)
Patient stated that she would like a detailed message left on her voicemail letting know if the medication was sent and for how many months.

## 2019-05-04 ENCOUNTER — Telehealth: Payer: Self-pay

## 2019-05-04 NOTE — Telephone Encounter (Signed)
PT can be seen by Amy NP Pt was last seen 04/2018 by Hoyle Sauer NP.She .was told in January 2020 to schedule a follow up appt. IF pt needs refills ongoing she has to schedule an appt to continue the medications.

## 2019-06-29 ENCOUNTER — Telehealth: Payer: Self-pay

## 2019-06-29 NOTE — Telephone Encounter (Signed)
Pt called and just wanted to give [place] an FYI that she is waiting to have her yearly eye exam with her ophthalmologist so that she can get her updated retinal exam and then she will call to schedule her follow up with Amy, NP

## 2019-08-06 ENCOUNTER — Telehealth: Payer: Self-pay | Admitting: Nurse Practitioner

## 2019-08-06 NOTE — Telephone Encounter (Signed)
Pt has called to schedule her annual f/u.  Pt was told by phone rep that a message would be sent to RN of Dr Joy Gentry to find out who her appointment would need to be set with.  Pt stated ok and will wait for a response.  Pt was seen by Dr Joy Gentry and NP Hoyle Sauer about extra brain fluid, no mention of stroke

## 2019-08-09 NOTE — Telephone Encounter (Signed)
I called pt and made appt with her for 09-14-19 at 1445 with AL/NP (has new eye doc (Dr. Peter Garter she is to bring report) with her.

## 2019-08-10 ENCOUNTER — Telehealth: Payer: Self-pay | Admitting: Family Medicine

## 2019-08-10 DIAGNOSIS — G932 Benign intracranial hypertension: Secondary | ICD-10-CM

## 2019-08-10 DIAGNOSIS — H4710 Unspecified papilledema: Secondary | ICD-10-CM

## 2019-08-10 NOTE — Telephone Encounter (Signed)
Please call patient and see if we can schedule her for a sooner follow-up.  I have reviewed notes from ophthalmology that suggest disc edema is still present.  We need to consider increasing medication.  Have her follow-up with me for an available provider as soon as possible.

## 2019-08-11 MED ORDER — ACETAZOLAMIDE 250 MG PO TABS: 500.0000 mg | ORAL_TABLET | Freq: Two times a day (BID) | ORAL | 1 refills | Status: DC

## 2019-08-11 NOTE — Telephone Encounter (Signed)
Pt has appt 09-13-19 earliest date that she can come in due to her work.  If you want to increase her medication she was ok to do that.  She stated she had been on 1000mg  diamox daily (split dose), now taking 500mg  daily (split dose).

## 2019-08-11 NOTE — Addendum Note (Signed)
Addended by: Brandon Melnick on: 08/11/2019 05:07 PM   Modules accepted: Orders

## 2019-08-11 NOTE — Telephone Encounter (Signed)
Have her increase dose to 500mg  in am and 250mg  in pm for 2 weeks then increase to 500mg  twice daily if well tolerated. We need her to keep follow up or schedule for sooner if something changes.

## 2019-08-11 NOTE — Telephone Encounter (Signed)
I called pharmacy, spoke to [place] and pt has been taking since 03-02-2018 250mg  po bid.

## 2019-08-11 NOTE — Telephone Encounter (Signed)
Will you verify fill dates with pharmacy please?

## 2019-08-26 ENCOUNTER — Other Ambulatory Visit: Payer: Self-pay

## 2019-08-26 DIAGNOSIS — Z20822 Contact with and (suspected) exposure to covid-19: Secondary | ICD-10-CM

## 2019-08-27 LAB — NOVEL CORONAVIRUS, NAA: SARS-CoV-2, NAA: DETECTED — AB

## 2019-08-28 ENCOUNTER — Telehealth: Payer: Self-pay | Admitting: Family Medicine

## 2019-08-28 ENCOUNTER — Telehealth: Payer: Self-pay | Admitting: Critical Care Medicine

## 2019-08-28 DIAGNOSIS — U071 COVID-19: Secondary | ICD-10-CM | POA: Insufficient documentation

## 2019-08-28 NOTE — Telephone Encounter (Signed)
[place]  Patient consented to have virtual visit. Method of visit: Telephone  Encounter participants: Patient: Joy Gentry - located at Home Provider: Daisy Floro - located at [facility] Others (if applicable): None  Chief Complaint: COVID  HPI: Patient was tested Thursday 11/5 for COVID and just found out she is positive. Emotionally she's not doing good. On Monday 11/2, she had some cold symptoms (head and nose congestion, things like that). At the end of the day she lost the sense of smell and test. She is having some sinus pressure.  She denies coughing. She is asking about Zinc and Vitamin C and D supplemention.   There is a person at home with diabetes and they share a common kitchen. She is asking about how best to manage the living situation.  She states he was considering going away for couple of weeks while she continues to heal.   ROS: per HPI  Pertinent PMHx:   Patient Active Problem List   Diagnosis Date Noted  . COVID-19 virus infection 08/28/2019  . Family history of heart disease 02/04/2018  . Hyperlipidemia 02/04/2018  . Birth control counseling 03/06/2017  . Pseudotumor cerebri 06/02/2015  . 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 History of Chiari malformation 05/29/2015  . Well woman exam 12/02/2012  . ANXIETY 12/18/2006     Exam:  Respiratory: speaking in full sentences, no coughing or shortness of breath observed during the conversation  Assessment/Plan: COVID-19 virus infection 1. Stay hydrated - aim for light yellow/clear urine 2. Flonase nasal spray to each nostril daily. 3. Tylenol for Fevers 4. Honey for cough TID. 5.  Patient is taking a prenatal vitamin which contains zinc, as well as vitamin C and vitamin B. She was advised against taking excessive amounts of zinc, was told she can continue her usual vitamin C and vitamin D supplementation.  Patient was emailed link to the NIH website  regarding adjunct therapies http://johns.biz/.  The patient was encouraged to contact the emergency line or the clinic should her symptoms worsen or should she have any additional questions regarding her diagnosis.   Time spent during visit with patient: 23:00 minutes  Milus Banister, [place], PGY-2 08/28/2019 12:33 PM

## 2019-08-28 NOTE — Assessment & Plan Note (Signed)
1. Stay hydrated - aim for light yellow/clear urine 2. Flonase nasal spray to each nostril daily. 3. Tylenol for Fevers 4. Honey for cough TID. 5.  Patient is taking a prenatal vitamin which contains zinc, as well as vitamin C and vitamin B. She was advised against taking excessive amounts of zinc, was told she can continue her usual vitamin C and vitamin D supplementation.  Patient was emailed link to the NIH website regarding adjunct therapies http://johns.biz/.  The patient was encouraged to contact the emergency line or the clinic should her symptoms worsen or should she have any additional questions regarding her diagnosis.

## 2019-08-28 NOTE — Telephone Encounter (Signed)
I was able to reach this patient who is Covid positive from a November 5th testing event.  I gave the patient her result and asked her to follow-up with her family medicine Center primary physician.  I note that she was able to contact and have a telemedicine visit with family medicine after I made this call.  I did give the patient a timeframe for when she should stay in isolation.  She stated her symptoms were mild at this time.  And again she said she would follow-up with primary care and appears this is already occurred.

## 2019-09-13 ENCOUNTER — Other Ambulatory Visit: Payer: Self-pay

## 2019-09-13 ENCOUNTER — Ambulatory Visit (INDEPENDENT_AMBULATORY_CARE_PROVIDER_SITE_OTHER): Payer: Managed Care, Other (non HMO) | Admitting: Family Medicine

## 2019-09-13 ENCOUNTER — Encounter: Payer: Self-pay | Admitting: Family Medicine

## 2019-09-13 ENCOUNTER — Telehealth: Payer: Self-pay | Admitting: Family Medicine

## 2019-09-13 ENCOUNTER — Encounter: Payer: Managed Care, Other (non HMO) | Admitting: Family Medicine

## 2019-09-13 VITALS — BP 103/66 | HR 66 | Temp 97.8°F | Ht 64.0 in | Wt 163.4 lb

## 2019-09-13 DIAGNOSIS — H4710 Unspecified papilledema: Secondary | ICD-10-CM

## 2019-09-13 DIAGNOSIS — G932 Benign intracranial hypertension: Secondary | ICD-10-CM | POA: Diagnosis not present

## 2019-09-13 MED ORDER — ACETAZOLAMIDE 250 MG PO TABS: 500.0000 mg | ORAL_TABLET | Freq: Two times a day (BID) | ORAL | 3 refills | Status: DC

## 2019-09-13 NOTE — Progress Notes (Signed)
I have read the note, and I agree with the clinical assessment and plan.  Richard A. Sater, MD, PhD, FAAN Certified in Neurology, Clinical Neurophysiology, Sleep Medicine, Pain Medicine and Neuroimaging  [facility] [address], [address] [place], [place] [address] [phone]  

## 2019-09-13 NOTE — Progress Notes (Signed)
PATIENT: Joy Gentry DOB: [birth year]  REASON FOR VISIT: follow up HISTORY FROM: patient  Chief Complaint  Patient presents with  . Follow-up    Yearly f/u. Alone. Rm 1. Patient mentioned that she would to dicuss the side effects from Diamox. She would also like to know how often a person should do a spinal tap.      HISTORY OF PRESENT ILLNESS: Today 09/13/19 Joy Gentry is a 48 y.o. female here today for follow up of IIH. Diamox was increased to 500mg  twice daily following report from ophthalmology in 07/2019 concerning for continued papilledema. She increased dose to 250mg  in am and 500mg  in the evenings. She was concerned about taking 500mg  twice daily. She wanted to talk to [place] before increasing this dose.  She denies any side effects.  She reports that headaches have completely resolved.  No visual changes or concerns.  She is uncertain of ophthalmology's recommendation for follow-up.  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   She stated that for the last several years she started to feel pressure-like feeling in the back of her head and upper neck, this feeling comes and goes, happens every week lasting hours. For the last 1 year also she started to have episodic vision changes, especially in the morning, with vision suddenly goes greyish for several seconds. It happens once or twice a months. She denies any frank headache, patient loss, nausea vomiting, loss of consciousness. She admitted that she normally had photophobia and phonophobia, which has been for her long time. She went to see her ophthalmologist Dr. Sherlean Foot for annual check 2 weeks ago, was found to have 3+ bilateral papilledema, concerning for pseudotumor cerebri, and referred her for neurology  consult.  She stated that about 27 years ago she had headache, mild right arm weakness, and mild problem with coordination. 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 However she cannot remember what the medical term of that condition. She stated that after surgery or her symptoms were gone.  She denies any history of migraine, hypertension, diabetes, hyperlipidemia. She stated that she gained 10 pounds this year, and now she is 170 pounds. She also admit that she also bathroom a lot.  She denies smoking, drinking or illicit drugs. She works in [employer], stressful job, she also experiences recent years of anxiety due to being single mom, insurance problems among others. 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  02/05/17 follow up- pt had LP on 06/13/15 and OP was 20cm H2O and CP was 3cm H2O. CSF unremarkable except WBC 11. She is asymptomatic clinically, no concern for CNS infection. CSF culture negative. She was put on diamox and titrated up to 500mg  bid. Complaint of drowsiness during the day, concerning for side effect from Diamox and put her on 500 mg Diamox at night and decreased morning dose of Diamox from 500 to 250. Tolerating well. She moved to [place], [place] in 09/2015, but did not follow neurology there. She followed with ophthalmology and continued to see improvement of her fundi exam  and recommended to stop diamox due to concern of kidney function with long term use of diamox. However, she does not feel comfortable to be off diamox completely at that time. After discussion, we decreased her dose to . In 04/2016, she lost her insurance so her diamox was stopped by her own. Although she was asymptomatic, recent eye exam on  01/28/17 showed bilateral disc margin indistinct, and Dr. Laural Benes mentioned "swelling is noticeable", and recommend to re-institute therapy. She denies any HA, vision disturbance. She has new prescription glass to be ready next week.   Interval history:11/08/17 Dr. Roda Shutters During the interval time,pt has been doing well. No visual disturbance, no HA. She still on diamox  bid. She followed up with Dr. Sherryle Lis on 09/19/17 and check fundi showed "bilateral disc margin is indistinct, disc size is large, there are breaks in the nerve fiber layer that do not extend back to the optic nerve margin". But no papilledema mentioned.  UPDATE 7/10/2019CM Joy Gentry, 48 year old female returns for follow-up with history of papilledema.  She is currently on Diamox 250 twice daily.  She had eye exam today at Dr. Henriette Combs.  Visual fields unchanged and stable.  She will follow-up yearly for visual field testing.  She denies any headache or visual disturbance.  She is due for routine labs through primary care.  She returns for reevaluation    REVIEW OF SYSTEMS: Out of a complete 14 system review of symptoms, the patient complains only of the following symptoms, frequency of urination and all other reviewed systems are negative.  ALLERGIES: No Known Allergies  HOME MEDICATIONS: Outpatient Medications Prior to Visit  Medication Sig Dispense Refill  . acetaZOLAMIDE (DIAMOX) 250 MG tablet Take 2 tablets (500 mg total) by mouth 2 (two) times daily. 360 tablet 1  . Multiple Vitamin (MULTIVITAMIN WITH MINERALS) TABS Take 1 tablet by mouth daily.    . Prenatal Multivit-Min-Fe-FA (PRENATAL VITAMINS PO) Take by mouth.    . vitamin B-12 (CYANOCOBALAMIN) 100 MCG tablet Take 100 mcg by mouth daily.     No facility-administered medications prior to visit.     PAST MEDICAL HISTORY: Past Medical History:  Diagnosis Date  . Vision abnormalities     Papilledema    PAST SURGICAL HISTORY: Past Surgical History:   Procedure Laterality Date  . neck sugery    . oophrectomy Left     FAMILY HISTORY: Family History  Problem Relation Age of Onset  . Hypertension Mother   . Hypertension Father     SOCIAL HISTORY: Social History   Socioeconomic History  . Marital status: Divorced    Spouse name: Not on file  . Number of children: Not on file  . Years of education: Not on file  . Highest education level: Not on file  Occupational History  . Not on file  Social Needs  . Financial resource strain: Not on file  . Food insecurity    Worry: Not on file    Inability: Not on file  . Transportation needs    Medical: Not on file    Non-medical: Not on file  Tobacco Use  . Smoking status: Never Smoker  . Smokeless tobacco: Never Used  Substance and Sexual Activity  . Alcohol use: No  . Drug use: No  . Sexual activity: Yes  Lifestyle  . Physical activity    Days per week: Not on file    Minutes per session: Not on file  . Stress: Not on file  Relationships  .  Social Musician on phone: Not on file    Gets together: Not on file    Attends religious service: Not on file    Active member of club or organization: Not on file    Attends meetings of clubs or organizations: Not on file    Relationship status: Not on file  . Intimate partner violence    Fear of current or ex partner: Not on file    Emotionally abused: Not on file    Physically abused: Not on file    Forced sexual activity: Not on file  Other Topics Concern  . Not on file  Social History Narrative  . Not on file      PHYSICAL EXAM  Vitals:   09/13/19 1340  BP: 103/66  Pulse: 66  Temp: 97.8 F (36.6 C)  TempSrc: Oral  Weight: 163 lb 6.4 oz (74.1 kg)  Height: 5\' 4"  (1.626 m)   Body mass index is 28.05 kg/m.  Generalized: Well developed, in no acute distress  Cardiology: normal rate and rhythm, no murmur noted Neurological examination  Mentation: Alert oriented to time, place, history taking.  Follows all commands speech and language fluent Cranial nerve II-XII: Pupils were equal round reactive to light. Extraocular movements were full, visual field were full on confrontational test. Unable to visualize optic disc due to small pupils  Motor: The motor testing reveals 5 over 5 strength of all 4 extremities. Good symmetric motor tone is noted throughout.  Sensory: Sensory testing is intact to soft touch on all 4 extremities. No evidence of extinction is noted.  Coordination: Cerebellar testing reveals good finger-nose-finger and heel-to-shin bilaterally.  Gait and station: Gait is normal.   DIAGNOSTIC DATA (LABS, IMAGING, TESTING) - I reviewed patient records, labs, notes, testing and imaging myself where available.  No flowsheet data found.   Lab Results  Component Value Date   WBC 3.9 03/06/2017   HGB 12.1 03/06/2017   HCT 37.3 03/06/2017   MCV 91 03/06/2017   PLT 338 03/06/2017      Component Value Date/Time   NA 144 03/06/2017 0947   K 4.4 03/06/2017 0947   CL 107 (H) 03/06/2017 0947   CO2 23 03/06/2017 0947   GLUCOSE 95 03/06/2017 0947   GLUCOSE 70 05/29/2015 1513   BUN 14 03/06/2017 0947   CREATININE 0.92 03/06/2017 0947   CREATININE 0.80 05/29/2015 1513   CALCIUM 9.0 03/06/2017 0947   PROT 7.5 05/29/2015 1513   ALBUMIN 4.1 05/29/2015 1513   AST 17 05/29/2015 1513   ALT 16 05/29/2015 1513   ALKPHOS 52 05/29/2015 1513   BILITOT 0.8 05/29/2015 1513   GFRNONAA 74 03/06/2017 0947   GFRAA 86 03/06/2017 0947   Lab Results  Component Value Date   CHOL 177 03/06/2017   HDL 61 03/06/2017   LDLCALC 108 (H) 03/06/2017   TRIG 42 03/06/2017   CHOLHDL 2.9 03/06/2017   Lab Results  Component Value Date   HGBA1C 5.2 03/06/2017   No results found for: VITAMINB12 Lab Results  Component Value Date   TSH 1.820 03/06/2017     ASSESSMENT AND PLAN 48 y.o. year old female  has a past medical history of Vision abnormalities. here with     ICD-10-Joy   1.  Pseudotumor cerebri  G93.2   2. Papilledema  H47.10     Joy Gentry continues to tolerate Diamox well.  She did increase evening dose to 500 mg but continued 250 mg in  the morning.  We have discussed risk and benefits of increasing medication.  She agrees to increase dose to 500 mg twice daily.  She will call me with any concerns or side effects as appropriate.  I have advised regular follow-up with ophthalmology.  Preferably a 3528-month follow-up if they are willing.  I would like to see her back in my office in 6 months as well.  She will call with any new or worsening symptoms.  She verbalizes understanding and agreement with this plan.   No orders of the defined types were placed in this encounter.    No orders of the defined types were placed in this encounter.     I spent 20 minutes with the patient. 50% of this time was spent counseling and educating patient on plan of care and medications.     Shawnie Dappermy Honesti Seaberg, FNP-C 09/13/2019, 1:47 PM [facility] [address], [address] [place], [place] [address] [phone]

## 2019-09-13 NOTE — Progress Notes (Deleted)
  Subjective:   Patient ID: Joy Gentry    DOB: [birth year], 48 y.o. female   MRN: [id number]  Joy Gentry is a 48 y.o. female with a history of pseudotumor cerebri, anxiety, hyperlipidemia here for   Need for Pap smear - G***P*** - Menses: *** - Contraception: *** - Cancer screening: Negative of 05/2015 with negative HPV  Healthcare Maintenance - Vaccines: Flu - Colonoscopy: N/A - Mammogram: *** - Pap Smear: Due  Recent COVID-19 infection -Tested positive for COVID-19 on 11/5 - Symptoms: ***  Review of Systems:  Per HPI.  Medications and smoking status reviewed.  Objective:   There were no vitals taken for this visit. Vitals and nursing note reviewed.  General: well nourished, well developed, in no acute distress with non-toxic appearance HEENT: normocephalic, atraumatic, moist mucous membranes Neck: supple, non-tender without lymphadenopathy CV: regular rate and rhythm without murmurs, rubs, or gallops, no lower extremity edema Lungs: clear to auscultation bilaterally with normal work of breathing Abdomen: soft, non-tender, non-distended, no masses or organomegaly palpable, normoactive bowel sounds Skin: warm, dry, no rashes or lesions Extremities: warm and well perfused, normal tone MSK: ROM grossly intact, gait normal Neuro: Alert and oriented, speech normal  Assessment & Plan:   No problem-specific Assessment & Plan notes found for this encounter.  No orders of the defined types were placed in this encounter.  No orders of the defined types were placed in this encounter.   Rory Percy, DO PGY-3, [facility] 09/13/2019 7:45 AM

## 2019-09-13 NOTE — Patient Instructions (Signed)
Continue Diamox 500mg  twice daily  Follow up closely with ophthalmology as advised  Follow up with me in 6 months, sooner if needed    Idiopathic Intracranial Hypertension  Idiopathic intracranial hypertension (IIH) is a condition that increases pressure around the brain. The fluid that surrounds the brain and spinal cord (cerebrospinal fluid, CSF) increases and causes the pressure. Idiopathic means that the cause of this condition is not known. IIH affects the brain and spinal cord (is a neurological disorder). If this condition is not treated, it can cause vision loss or blindness. What increases the risk? You are more likely to develop this condition if:  You are severely overweight (obese).  You are a woman who has not gone through menopause.  You take certain medicines, such as birth control or steroids. What are the signs or symptoms? Symptoms of IIH include:  Headaches. This is the most common symptom.  Pain in the shoulders or neck.  Nausea and vomiting.  A "rushing water" or pulsing sound within the ears (pulsatile tinnitus).  Double vision.  Blurred vision.  Brief episodes of complete vision loss. How is this diagnosed? This condition may be diagnosed based on:  Your symptoms.  Your medical history.  CT scan of the brain.  MRI of the brain.  Magnetic resonance venogram (MRV) to check veins in the brain.  Diagnostic lumbar puncture. This is a procedure to remove and examine a sample of cerebrospinal fluid. This procedure can determine whether too much fluid may be causing IIH.  A thorough eye exam to check for swelling or nerve damage in the eyes. How is this treated? Treatment for this condition depends on your symptoms. The goal of treatment is to decrease the pressure around your brain. Common treatments include:  Medicines to decrease the production of spinal fluid and lower the pressure within your skull.  Medicines to prevent or treat headaches.   Surgery to place drains (shunts) in your brain to remove excess fluid.  Lumbar puncture to remove excess cerebrospinal fluid. Follow these instructions at home:  If you are overweight or obese, work with your health care provider to lose weight.  Take over-the-counter and prescription medicines only as told by your health care provider.  Do not drive or use heavy machinery while taking medicines that can make you sleepy.  Keep all follow-up visits as told by your health care provider. This is important. Contact a health care provider if:  You have changes in your vision, such as: ? Double vision. ? Not being able to see colors (color vision). Get help right away if:  You have any of the following symptoms and they get worse or do not get better. ? Headaches. ? Nausea. ? Vomiting. ? Vision changes or difficulty seeing. Summary  Idiopathic intracranial hypertension (IIH) is a condition that increases pressure around the brain. The cause is not known (is idiopathic).  The most common symptom of IIH is headaches.  Treatment may include medicines or surgery to relieve the pressure on your brain. This information is not intended to replace advice given to you by your health care provider. Make sure you discuss any questions you have with your health care provider. Document Released: 12/16/2001 Document Revised: 09/19/2017 Document Reviewed: 08/28/2016 Elsevier Patient Education  2020 [employer].

## 2019-09-13 NOTE — Telephone Encounter (Signed)
Patient states she will call to schedule a follow-up as needed after appt with eye doctor. 09/13/19 TC

## 2019-09-14 ENCOUNTER — Ambulatory Visit: Payer: Self-pay | Admitting: Family Medicine

## 2019-10-19 NOTE — Progress Notes (Deleted)
Subjective:  CC -- Annual Physical; With complaints of ***  Pt reports she ***   Cardiovascular: - Risk as of ***(date): *** (assessment every 3-5 years) - Dx Hypertension: {YES/NO/WILD WVPXT:06269}  - Dx Hyperlipidemia: {YES/NO/WILD SWNIO:27035} (once age 49-21; high risk >35yo, low risk >45)*** - Dx Obesity: {YES/NO/WILD CARDS:18581} (Class I BMI <34.9, Class II <39.9, Class III < 49.9)*** - Physical Activity: {YES/NO/WILD KKXFG:18299}  - Diabetes: {YES/NO/WILD BZJIR:67893} (age 35-70 w/ BMI >25, or HTN, or HLD) *** (A1c >6.5, or classic Sxs w/ random CBG >200, or FBG >126 (fasting >8hr))***  Cancer: Colorectal >> Colonoscopy: {YES/NO/WILD YBOFB:51025} (Risk factors?, W/o risk factors > 50yo)*** Lung >> Tobacco Use: {YES/NO/WILD ENIDP:82423} (age 58-74 w/ >69yr/pack/yr Hx, unless quit >58yr ago) ***  - If so, previous Low-Dose CT screen: {YES/NO/WILD NTIRW:43154}  Breast >> Mammogram: {YES/NO/WILD CARDS:18581} (>40yo to be discussed; Q25yrs)*** Cervical/Endometrial >>  - Postmenopausal: {YES/NO/WILD MGQQP:61950}  - Vaginal Bleeding: {YES/NO/WILD DTOIZ:12458} - Pap Smear: Last pap negative in 2016, neg HPV   - Previous Abnormal Pap: {YES/NO/WILD CARDS:18581} (21-29yo Q81yrs; >30yo Q20yr w/ neg HPV)*** Skin >> Suspicious lesions: {YES/NO/WILD KDXIP:38250}   Social: Alcohol Use: {YES/NO/WILD NLZJQ:73419}  Tobacco Use: {YES/NO/WILD FXTKW:40973}   - Interested in Quitting: {YES/NO/WILD ZHGDJ:24268}  Other Drugs: {YES/NO/WILD TMHDQ:22297}  Risky Sexual Behavior: {YES/NO/WILD CARDS:18581}  - Chlamydia: <25yo, or >25yo and incr risk - Gonorrhea: sexually active <25yo, or at increased risk - Syphilis: If at increased risk - HIV: All individuals (15-18yo once, then annually if risks are high) >> should be checked anytime STDs are checked. - Hep C: once if born in [place] between 1945-1965; or at increased risk - Hep B: If at increased risk*** Depression: {YES/NO/WILD CARDS:18581}   - PHQ9  score:  Support and Life at Home: {YES/NO/WILD LGXQJ:19417}   Other: Osteoporosis: {YES/NO/WILD CARDS:18581} (postmenopausal women <65yo with risk factors -- do BMD screen)*** Zoster Vaccine: {YES/NO/WILD EYCXK:48185} (those >50yo)*** Flu Vaccine: {YES/NO/WILD UDJSH:70263}  Pneumonia Vaccine: {YES/NO/WILD ZCHYI:50277} (those w/ risk factors) - Both: Immunocompromised, cochlear implant, CSF leak, asplenic, sickle cell, CKD - PPSV-23 only: Heart dz, lung disease, DM, tobacco abuse, alcoholism, cirrhosis/liver disease.***   ROS-  Past Medical History Patient Active Problem List   Diagnosis Date Noted  . COVID-19 virus infection 08/28/2019  . Family history of heart disease 02/04/2018  . Hyperlipidemia 02/04/2018  . Birth control counseling 03/06/2017  . Pseudotumor cerebri 06/02/2015  . 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 History of Chiari malformation 05/29/2015  . Well woman exam 12/02/2012  . ANXIETY 12/18/2006    Medications- reviewed and updated Current Outpatient Medications  Medication Sig Dispense Refill  . acetaZOLAMIDE (DIAMOX) 250 MG tablet Take 2 tablets (500 mg total) by mouth 2 (two) times daily. 360 tablet 3  . Multiple Vitamin (MULTIVITAMIN WITH MINERALS) TABS Take 1 tablet by mouth daily.    . Prenatal Multivit-Min-Fe-FA (PRENATAL VITAMINS PO) Take by mouth.    . vitamin B-12 (CYANOCOBALAMIN) 100 MCG tablet Take 100 mcg by mouth daily.     No current facility-administered medications for this visit.    Objective: There were no vitals taken for this visit. Gen: NAD, alert, cooperative with exam*** HEENT: NCAT, EOMI, PERRL CV: RRR, good S1/S2, no murmur Resp: CTABL, no wheezes, non-labored Abd: Soft, Non Tender, Non Distended, BS present, no guarding or organomegaly Genital Exam: {genitourinary exam:311642::"not done"} Ext: No edema, warm Neuro: Alert and oriented, No gross deficits   Assessment/Plan:  No problem-specific Assessment & Plan notes found for this  encounter.  No orders of the defined types were placed in this encounter.   No orders of the defined types were placed in this encounter.    Oralia Manis, DO, PGY-3 10/19/2019 12:50 PM

## 2019-10-20 ENCOUNTER — Ambulatory Visit: Payer: Managed Care, Other (non HMO) | Admitting: Family Medicine

## 2019-10-20 ENCOUNTER — Encounter: Payer: Managed Care, Other (non HMO) | Admitting: Family Medicine

## 2019-12-31 ENCOUNTER — Ambulatory Visit (INDEPENDENT_AMBULATORY_CARE_PROVIDER_SITE_OTHER): Payer: Managed Care, Other (non HMO) | Admitting: Family Medicine

## 2019-12-31 ENCOUNTER — Other Ambulatory Visit (HOSPITAL_COMMUNITY)
Admission: RE | Admit: 2019-12-31 | Discharge: 2019-12-31 | Disposition: A | Discharge status: Home / Self Care | Payer: Managed Care, Other (non HMO) | Source: Ambulatory Visit | Attending: Family Medicine | Admitting: Family Medicine

## 2019-12-31 ENCOUNTER — Other Ambulatory Visit: Payer: Self-pay

## 2019-12-31 ENCOUNTER — Encounter: Payer: Self-pay | Admitting: Family Medicine

## 2019-12-31 VITALS — BP 120/70 | HR 72 | Wt 160.4 lb

## 2019-12-31 DIAGNOSIS — G932 Benign intracranial hypertension: Secondary | ICD-10-CM

## 2019-12-31 DIAGNOSIS — E785 Hyperlipidemia, unspecified: Secondary | ICD-10-CM | POA: Diagnosis not present

## 2019-12-31 DIAGNOSIS — Z124 Encounter for screening for malignant neoplasm of cervix: Secondary | ICD-10-CM

## 2019-12-31 DIAGNOSIS — Z1231 Encounter for screening mammogram for malignant neoplasm of breast: Secondary | ICD-10-CM

## 2019-12-31 DIAGNOSIS — Z1211 Encounter for screening for malignant neoplasm of colon: Secondary | ICD-10-CM

## 2019-12-31 DIAGNOSIS — Z01419 Encounter for gynecological examination (general) (routine) without abnormal findings: Secondary | ICD-10-CM

## 2019-12-31 NOTE — Assessment & Plan Note (Addendum)
No concerns.  Performed Pap with HPV cotesting today.  Placed referrals and given information for mammogram and colonoscopy.  Discussed following a well-balanced diet and staying active as possible.  Will obtain a lipid panel to follow-up previous hyperlipidemia.  Per patient request, will obtain CBC and BMP to ensure no underlying anemia or renal dysfunction.

## 2019-12-31 NOTE — Progress Notes (Signed)
   Subjective:  CC -- Annual Physical   Pt reports she is doing well, no concerns today.  She would like general blood work completed today.  Cardiovascular: - Dx Hypertension: no  - Dx Hyperlipidemia: yes, LDL 108 in 2018 - Dx Obesity: No, however BMI 27 - Physical Activity: yes  - Diabetes: no   Cancer:  Colorectal >> Colonoscopy: Turns 49 years old tomorrow, will place referral.  No family history of colon cancer. Lung >> Tobacco Use: no  Breast >> Mammogram: yes, has not had one in 4 years.  No family history of her breast, ovarian, or endometrial cancer. Cervical/Endometrial >>  - Postmenopausal: yes  - Vaginal Bleeding: no - Pap Smear: yes, will performed today  - Previous Abnormal Pap: No Skin >> Suspicious lesions: no   Social: Alcohol Use: no  Tobacco Use: no  Other Drugs: no  Interested in STD screening: no  Depression: no, PHQ 2 score 0 Support and Life at Home: yes   Other: Osteoporosis: no Zoster Vaccine: no  Flu Vaccine: no  Pneumonia Vaccine: no  Past Medical History and Medications- reviewed and updated  Objective: BP 120/70   Pulse 72   Wt 160 lb 6.4 oz (72.8 kg)   SpO2 99%   BMI 27.53 kg/m  Gen: NAD, alert, cooperative with exam HEENT: NCAT, EOMI, PERRL CV: RRR, good S1/S2, no murmur Resp: CTABL, no wheezes, non-labored Abd: Soft, Non Tender Genital Exam: not done Pelvic exam: VULVA: normal appearing vulva with no masses, tenderness or lesions, VAGINA: normal appearing vagina with normal color and discharge, no lesions, CERVIX: normal appearing cervix without discharge or lesions, slightly friable with instrumentation UTERUS: uterus is normal size, shape, consistency and nontender, ADNEXA: normal adnexa in size, nontender and no masses. Ext: No edema, warm Neuro: Alert and oriented, No gross deficits  Assessment/Plan:  Well woman exam No concerns.  Performed Pap with HPV cotesting today.  Placed referrals and given information for mammogram  and colonoscopy.  Discussed following a well-balanced diet and staying active as possible.  Will obtain a lipid panel to follow-up previous hyperlipidemia.  Per patient request, will obtain CBC and BMP to ensure no underlying anemia or renal dysfunction.  Pseudotumor cerebri Follows with neurology, on stable dose of Diamox.  Hyperlipidemia Repeat lipid panel today.  She does appear to have a family history of heart disease, could discuss this further when results return if she is interested in considering statin.   Follow-up in 1 year for annual exam or sooner if needed.  Allayne Stack 12/31/2019 12:17 PM

## 2019-12-31 NOTE — Assessment & Plan Note (Signed)
Repeat lipid panel today.  She does appear to have a family history of heart disease, could discuss this further when results return if she is interested in considering statin.

## 2019-12-31 NOTE — Patient Instructions (Addendum)
Wonderful to see you this morning! I will let you know your pap results in about 1 week or sooner if they return. Happy birthday!

## 2019-12-31 NOTE — Assessment & Plan Note (Signed)
Follows with neurology, on stable dose of Diamox.

## 2020-01-01 LAB — BASIC METABOLIC PANEL
BUN/Creatinine Ratio: 18 (ref 9–23)
BUN: 16 mg/dL (ref 6–24)
CO2: 19 mmol/L — ABNORMAL LOW (ref 20–29)
Calcium: 9.2 mg/dL (ref 8.7–10.2)
Chloride: 106 mmol/L (ref 96–106)
Creatinine, Ser: 0.9 mg/dL (ref 0.57–1.00)
GFR calc Af Amer: 87 mL/min/{1.73_m2} (ref >59)
GFR calc non Af Amer: 75 mL/min/{1.73_m2} (ref >59)
Glucose: 88 mg/dL (ref 65–99)
Potassium: 3.9 mmol/L (ref 3.5–5.2)
Sodium: 141 mmol/L (ref 134–144)

## 2020-01-01 LAB — LIPID PANEL
Chol/HDL Ratio: 3.2 ratio (ref 0.0–4.4)
Cholesterol, Total: 191 mg/dL (ref 100–199)
HDL: 59 mg/dL (ref >39)
LDL Chol Calc (NIH): 125 mg/dL — ABNORMAL HIGH (ref 0–99)
Triglycerides: 38 mg/dL (ref 0–149)
VLDL Cholesterol Cal: 7 mg/dL (ref 5–40)

## 2020-01-01 LAB — CBC
Hematocrit: 37.5 % (ref 34.0–46.6)
Hemoglobin: 12.5 g/dL (ref 11.1–15.9)
MCH: 30.3 pg (ref 26.6–33.0)
MCHC: 33.3 g/dL (ref 31.5–35.7)
MCV: 91 fL (ref 79–97)
Platelets: 296 10*3/uL (ref 150–450)
RBC: 4.13 x10E6/uL (ref 3.77–5.28)
RDW: 12.1 % (ref 11.7–15.4)
WBC: 4.2 10*3/uL (ref 3.4–10.8)

## 2020-01-04 LAB — CYTOLOGY - PAP
Comment: NEGATIVE
Diagnosis: UNDETERMINED — AB
High risk HPV: NEGATIVE

## 2020-02-07 ENCOUNTER — Encounter: Payer: Self-pay | Admitting: Gastroenterology

## 2020-02-17 ENCOUNTER — Encounter: Payer: Self-pay | Admitting: Gastroenterology

## 2020-02-24 ENCOUNTER — Encounter: Payer: Self-pay | Admitting: Gastroenterology

## 2020-03-16 ENCOUNTER — Encounter: Payer: Managed Care, Other (non HMO) | Admitting: Gastroenterology

## 2020-04-21 ENCOUNTER — Encounter: Payer: Managed Care, Other (non HMO) | Admitting: Gastroenterology

## 2020-07-19 ENCOUNTER — Encounter: Payer: Self-pay | Admitting: Family Medicine

## 2020-10-05 ENCOUNTER — Telehealth: Payer: Self-pay | Admitting: Family Medicine

## 2020-10-05 DIAGNOSIS — G932 Benign intracranial hypertension: Secondary | ICD-10-CM

## 2020-10-05 DIAGNOSIS — H4710 Unspecified papilledema: Secondary | ICD-10-CM

## 2020-10-05 MED ORDER — ACETAZOLAMIDE 250 MG PO TABS: 500.0000 mg | ORAL_TABLET | Freq: Two times a day (BID) | ORAL | 0 refills | Status: DC

## 2020-10-05 NOTE — Telephone Encounter (Signed)
Are you ok to do this.  Last seen 09-13-19 AL/NP.

## 2020-10-05 NOTE — Telephone Encounter (Signed)
Ok to fill for 30 days but then needs follow up with [place] for continued refills.

## 2020-10-05 NOTE — Telephone Encounter (Signed)
Pt. states her vision tests are very expensive & she is currently not able to pay for them to receive her prescription for acetaZOLAMIDE (DIAMOX) 250 MG tablet. She is asking if Dr. can fill the prescription 6 more months until she is able to get tests done again. She states once she is able to begin again, she will call us with the results to make an appt. She asks if she's not avail. to take the call, for a detailed message to be left for her.   [phone]

## 2020-10-05 NOTE — Telephone Encounter (Signed)
I called pt and and relayed that for refills that we see pts once yearly for follow up.  Will refill for 30 days.  I read message back from phone staff and that it was clear relating her message.  She did not want to make appt. And would call back.  She understands that would only be for 30 days.  She did not want to come in to see [place] until got eye exam results.  Had questions for billing.

## 2020-10-05 NOTE — Addendum Note (Signed)
Addended by: Hermenia Fiscal S on: 10/05/2020 02:50 PM   Modules accepted: Orders

## 2020-10-10 ENCOUNTER — Other Ambulatory Visit: Payer: Self-pay | Admitting: *Deleted

## 2020-10-10 DIAGNOSIS — G932 Benign intracranial hypertension: Secondary | ICD-10-CM

## 2020-10-10 DIAGNOSIS — H4710 Unspecified papilledema: Secondary | ICD-10-CM

## 2020-10-10 MED ORDER — ACETAZOLAMIDE 250 MG PO TABS: 500.0000 mg | ORAL_TABLET | Freq: Two times a day (BID) | ORAL | 2 refills | Status: DC

## 2020-10-10 NOTE — Telephone Encounter (Signed)
Done

## 2020-10-10 NOTE — Telephone Encounter (Signed)
Pt. called for 1-2 mo. supply of acetaZOLAMIDE (DIAMOX) 250 MG tablet.  [facility] [phone]

## 2020-10-10 NOTE — Telephone Encounter (Signed)
Pt called she wanted refills up to her appt.

## 2020-12-04 ENCOUNTER — Telehealth: Payer: Self-pay | Admitting: *Deleted

## 2020-12-04 NOTE — Telephone Encounter (Signed)
Received vision records.  To your inbox.

## 2020-12-04 NOTE — Telephone Encounter (Signed)
Please let her know that I ave reviewed her eye exam notes. Looks like disc edema and visual field testing is stable. He mentioned that she should discuss "full dose" of Diamox with [place] at follow up in March. Please confirm what dose she is taking now. Prescribed for Diamox 500mg  BID. TY!

## 2020-12-05 NOTE — Telephone Encounter (Signed)
I spoke to pt and she will keep on 500mg  po bid diamox.  And see at appt.

## 2020-12-05 NOTE — Telephone Encounter (Signed)
I called pt as not heard back from mychart note.  She stated that due to sleepiness and monetary funds she did in the last year take 250 am 500pm, then sometimes take 250 am 250pm.  Since the last eye exam she is now taking 500mg  po bid.  FYI. Confirmed appt 01-10-21 at 2p.

## 2020-12-05 NOTE — Telephone Encounter (Signed)
Perfect. Continue 500mg  BID. Will see her at follow up.

## 2021-01-04 NOTE — Telephone Encounter (Signed)
Error

## 2021-01-10 ENCOUNTER — Other Ambulatory Visit: Payer: Self-pay

## 2021-01-10 ENCOUNTER — Ambulatory Visit: Payer: Managed Care, Other (non HMO) | Admitting: Family Medicine

## 2021-01-10 ENCOUNTER — Encounter: Payer: Self-pay | Admitting: Family Medicine

## 2021-01-10 ENCOUNTER — Ambulatory Visit (INDEPENDENT_AMBULATORY_CARE_PROVIDER_SITE_OTHER): Payer: Managed Care, Other (non HMO) | Admitting: Family Medicine

## 2021-01-10 VITALS — BP 113/72 | HR 80 | Ht 64.0 in | Wt 154.0 lb

## 2021-01-10 DIAGNOSIS — H4710 Unspecified papilledema: Secondary | ICD-10-CM | POA: Diagnosis not present

## 2021-01-10 DIAGNOSIS — G932 Benign intracranial hypertension: Secondary | ICD-10-CM | POA: Diagnosis not present

## 2021-01-10 MED ORDER — ACETAZOLAMIDE 250 MG PO TABS: 500.0000 mg | ORAL_TABLET | Freq: Two times a day (BID) | ORAL | 11 refills | Status: DC

## 2021-01-10 NOTE — Patient Instructions (Signed)
Below is our plan:  We will continue acetazolamide 500mg  twice daily (two tablets twice daily).    Please make sure you are staying well hydrated. I recommend 50-60 ounces daily. Well balanced diet and regular exercise encouraged. Consistent sleep schedule with 6-8 hours recommended.   Please continue follow up with care team as directed.   Follow up with me in 1 year, may be virtual on MyChart if doing well!  You may receive a survey regarding today's visit. I encourage you to leave honest feed back as I do use this information to improve patient care. Thank you for seeing me today!       Idiopathic Intracranial Hypertension  Idiopathic intracranial hypertension (IIH) is a condition that increases pressure around the brain. The fluid that surrounds the brain and spinal cord (cerebrospinal fluid, or CSF) increases and causes the pressure. Idiopathic means that the cause of this condition is not known. IIH affects the brain and spinal cord (neurological disorder). If this condition is not treated, it can cause vision loss or blindness. What are the causes? The cause of this condition is not known. What increases the risk? The following factors may make you more likely to develop this condition:  Being very overweight (obese).  Being a female between the ages of 72 and 18 years old, who has not gone through menopause.  Taking certain medicines, such as birth control or steroids. What are the signs or symptoms? Symptoms of this condition include:  Headaches. This is the most common symptom.  Brief episodes of total blindness.  Double vision, blurred vision, or poor side (peripheral) vision.  Pain in the shoulders or neck.  Nausea and vomiting.  A sound like rushing water or a pulsing sound within the ears (pulsatile tinnitus), or ringing in the ears. How is this diagnosed? This condition may be diagnosed based on:  Your symptoms and medical history.  Imaging tests of  the brain, such as: ? CT scan. ? MRI. ? Magnetic resonance venogram (MRV) to check the veins.  Diagnostic lumbar puncture. This is a procedure to remove and examine a sample of cerebrospinal fluid. This procedure can determine whether too much fluid may be causing IIH.  A thorough eye exam to check for swelling or nerve damage in the eyes. How is this treated? Treatment for this condition depends on the symptoms. The goal of treatment is to decrease the pressure around your brain. Common treatments include:  Weight loss through healthy eating, salt restriction, and exercise, if you are overweight.  Medicines to decrease the production of spinal fluid and lower the pressure within your skull.  Medicines to prevent or treat headaches. Other treatments may include:  Surgery to place drains (shunts) in your brain for removing excess fluid.  Lumbar puncture to remove excess cerebrospinal fluid. Follow these instructions at home:  If you are overweight or obese, work with your health care provider to lose weight.  Take over-the-counter and prescription medicines only as told by your health care provider.  Ask your health care provider if the medicine prescribed to you requires you to avoid driving or using machinery.  Do not use any products that contain nicotine or tobacco, such as cigarettes, e-cigarettes, and chewing tobacco. If you need help quitting, ask your health care provider.  Keep all follow-up visits as told by your health care provider. This is important. Contact a health care provider if: You have changes in your vision, such as:  Double vision.  Blurred vision.  Poor peripheral vision. Get help right away if: You have any of the following symptoms and they get worse or do not get better:  Headaches.  Nausea.  Vomiting.  Sudden trouble seeing. Summary  Idiopathic intracranial hypertension (IIH) is a condition that increases pressure around the brain. The  cause is not known (is idiopathic).  The most common symptom of IIH is headaches. Vision changes, pain in the shoulders or neck, nausea, and vomiting may also occur.  Treatment for this condition depends on your symptoms. The goal of treatment is to decrease the pressure around your brain.  If you are overweight or obese, work with your health care provider to lose weight.  Take over-the-counter and prescription medicines only as told by your health care provider. This information is not intended to replace advice given to you by your health care provider. Make sure you discuss any questions you have with your health care provider. Document Revised: 09/18/2019 Document Reviewed: 09/18/2019 Elsevier Patient Education  2021 [employer].

## 2021-01-10 NOTE — Progress Notes (Signed)
I have read the note, and I agree with the clinical assessment and plan.  Torin Modica A. Mae Cianci, MD, PhD, FAAN Certified in Neurology, Clinical Neurophysiology, Sleep Medicine, Pain Medicine and Neuroimaging  [facility] [address], [address] [place], [place] [address] [phone]  

## 2021-01-10 NOTE — Progress Notes (Signed)
PATIENT: Joy Gentry DOB: Feb 26, 1971  REASON FOR VISIT: follow up HISTORY FROM: patient  Chief Complaint  Patient presents with  . 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 Overall doing well     HISTORY OF PRESENT ILLNESS: 01/10/21 ALL:  She returns for follow up for IIH. Last seen 08/2019 and advised to increase Diamox to 500mg  BID due to continued papilledema on ophthalmology exam in 07/2019. She had increased dose but had a period of time where she could not afford her medication so she decreased dose to make it last. She has been back on 500mg  twice daily for the past 2-3 months. Eye exam was stable with no significant changes but no improvement.   09/13/2019 ALL:  Joy Gentry is a 50 y.o. female here today for follow up of IIH. Diamox was increased to 500mg  twice daily following report from ophthalmology in 07/2019 concerning for continued papilledema. She increased dose to 250mg  in am and 500mg  in the evenings. She was concerned about taking 500mg  twice daily. She wanted to talk to 43 before increasing this dose.  She denies any side effects.  She reports that headaches have completely resolved.  No visual changes or concerns.  She is uncertain of ophthalmology's recommendation for follow-up.  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   She stated that for the last several years she started to feel pressure-like feeling in the back of her head and upper neck, this feeling comes and goes, happens every week lasting hours. For the last 1 year also she started to have episodic vision changes, especially in the morning, with vision suddenly goes greyish for several seconds. It happens once or twice a months. She  denies any frank headache, patient loss, nausea vomiting, loss of consciousness. She admitted that she normally had photophobia and phonophobia, which has been for her long time. She went to see her ophthalmologist Dr. for annual check 2 weeks ago, was found to have 3+ bilateral papilledema, concerning for pseudotumor cerebri, and referred her for neurology consult.  She stated that about 27 years ago she had headache, mild right arm weakness, and mild problem with coordination. 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 However she cannot remember what the medical term of that condition. She stated that after surgery or her symptoms were gone.  She denies any history of migraine, hypertension, diabetes, hyperlipidemia. She stated that she gained 10 pounds this year, and now she is 170 pounds. She also admit that she also bathroom a lot.  She denies smoking, drinking or illicit drugs. She works in [place], stressful job, she also experiences recent years of anxiety due to being single mom, insurance problems among others. 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  02/05/17 follow up- pt had LP on 06/13/15 and OP was 20cm H2O and CP was 3cm H2O. CSF unremarkable except WBC 11. She is asymptomatic clinically, no concern for CNS infection. CSF culture negative. She was  put on diamox and titrated up to 500mg  bid. Complaint of drowsiness during the day, concerning for side effect from Diamox and put her on 500 mg Diamox at night and decreased morning dose of Diamox from 500 to 250. Tolerating well. She moved to [place], [place] in 09/2015, but did not follow neurology there. She followed with ophthalmology and continued to see improvement of  her fundi exam and recommended to stop diamox due to concern of kidney function with long term use of diamox. However, she does not feel comfortable to be off diamox completely at that time. After discussion, we decreased her dose to 250mg . In 04/2016, she lost her insurance so her diamox was stopped by her own. Although she was asymptomatic, recent eye exam on 01/28/17 showed bilateral disc margin indistinct, and Dr. 05/2016 mentioned "swelling is noticeable", and recommend to re-institute therapy. She denies any HA, vision disturbance. She has new prescription glass to be ready next week.   Interval history:11/08/17 Dr. Laural Benes During the interval time,pt has been doing well. No visual disturbance, no HA. She still on diamox 250mg  bid. She followed up with Dr. 11/10/17 on 09/19/17 and check fundi showed "bilateral disc margin is indistinct, disc size is large, there are breaks in the nerve fiber layer that do not extend back to the optic nerve margin". But no papilledema mentioned.  UPDATE 7/10/2019CM Joy Gentry, 50 year old female returns for follow-up with history of papilledema.  She is currently on Diamox 250 twice daily.  She had eye exam today at Dr. 06/30/2018.  Visual fields unchanged and stable.  She will follow-up yearly for visual field testing.  She denies any headache or visual disturbance.  She is due for routine labs through primary care.  She returns for reevaluation    REVIEW OF SYSTEMS: Out of a complete 14 system review of symptoms, the patient complains only of the following symptoms, frequency of urination and all other reviewed systems are negative.  ALLERGIES: No Known Allergies  HOME MEDICATIONS: Outpatient Medications Prior to Visit  Medication Sig Dispense Refill  . Prenatal Multivit-Min-Fe-FA (PRENATAL VITAMINS PO) Take by mouth.    . vitamin B-12 (CYANOCOBALAMIN) 100 MCG tablet Take 100 mcg by mouth daily.    Joy Gentry acetaZOLAMIDE (DIAMOX) 250 MG tablet Take 2 tablets (500 mg  total) by mouth 2 (two) times daily. 120 tablet 2  . Multiple Vitamin (MULTIVITAMIN WITH MINERALS) TABS Take 1 tablet by mouth daily.     No facility-administered medications prior to visit.    PAST MEDICAL HISTORY: Past Medical History:  Diagnosis Date  . Vision abnormalities     Papilledema    PAST SURGICAL HISTORY: Past Surgical History:  Procedure Laterality Date  . neck sugery    . oophrectomy Left     FAMILY HISTORY: Family History  Problem Relation Age of Onset  . Hypertension Mother   . Hypertension Father     SOCIAL HISTORY: Social History   Socioeconomic History  . Marital status: Divorced    Spouse name: Not on file  . Number of children: Not on file  . Years of education: Not on file  . Highest education level: Not on file  Occupational History  . Not on file  Tobacco Use  . Smoking status: Never Smoker  . Smokeless tobacco: Never Used  Substance and Sexual Activity  . Alcohol use: No  . Drug use: No  . Sexual activity: Yes  Other Topics Concern  . Not on file  Social History Narrative  .  Not on file   Social Determinants of Health   Financial Resource Strain: Not on file  Food Insecurity: Not on file  Transportation Needs: Not on file  Physical Activity: Not on file  Stress: Not on file  Social Connections: Not on file  Intimate Partner Violence: Not on file      PHYSICAL EXAM  Vitals:   01/10/21 1400  BP: 113/72  Pulse: 80  Weight: 154 lb (69.9 kg)  Height: 5\' 4"  (1.626 m)   Body mass index is 26.43 kg/m.  Generalized: Well developed, in no acute distress  Cardiology: normal rate and rhythm, no murmur noted Neurological examination  Mentation: Alert oriented to time, place, history taking. Follows all commands speech and language fluent Cranial nerve II-XII: Pupils were equal round reactive to light. Extraocular movements were full, visual field were full on confrontational test. Unable to visualize optic disc due to small  pupils  Motor: The motor testing reveals 5 over 5 strength of all 4 extremities. Good symmetric motor tone is noted throughout.  Sensory: Sensory testing is intact to soft touch on all 4 extremities. No evidence of extinction is noted.  Coordination: Cerebellar testing reveals good finger-nose-finger and heel-to-shin bilaterally.  Gait and station: Gait is normal.   DIAGNOSTIC DATA (LABS, IMAGING, TESTING) - I reviewed patient records, labs, notes, testing and imaging myself where available.  No flowsheet data found.   Lab Results  Component Value Date   WBC 4.2 12/31/2019   HGB 12.5 12/31/2019   HCT 37.5 12/31/2019   MCV 91 12/31/2019   PLT 296 12/31/2019      Component Value Date/Time   NA 141 12/31/2019 1119   K 3.9 12/31/2019 1119   CL 106 12/31/2019 1119   CO2 19 (L) 12/31/2019 1119   GLUCOSE 88 12/31/2019 1119   GLUCOSE 70 05/29/2015 1513   BUN 16 12/31/2019 1119   CREATININE 0.90 12/31/2019 1119   CREATININE 0.80 05/29/2015 1513   CALCIUM 9.2 12/31/2019 1119   PROT 7.5 05/29/2015 1513   ALBUMIN 4.1 05/29/2015 1513   AST 17 05/29/2015 1513   ALT 16 05/29/2015 1513   ALKPHOS 52 05/29/2015 1513   BILITOT 0.8 05/29/2015 1513   GFRNONAA 75 12/31/2019 1119   GFRAA 87 12/31/2019 1119   Lab Results  Component Value Date   CHOL 191 12/31/2019   HDL 59 12/31/2019   LDLCALC 125 (H) 12/31/2019   TRIG 38 12/31/2019   CHOLHDL 3.2 12/31/2019   Lab Results  Component Value Date   HGBA1C 5.2 03/06/2017   No results found for: [id number] Lab Results  Component Value Date   TSH 1.820 03/06/2017     ASSESSMENT AND PLAN 50 y.o. year old female  has a past medical history of Vision abnormalities. here with     ICD-10-CM   1. Pseudotumor cerebri  G93.2 acetaZOLAMIDE (DIAMOX) 250 MG tablet  2. Papilledema  H47.10 acetaZOLAMIDE (DIAMOX) 250 MG tablet     Joy Gentry continues to tolerate Diamox well. She will continue Diamox 500mg  twice daily.  I have advised regular  follow-up with ophthalmology.  Healthy lifestyle habits encouraged. She will call with any new or worsening symptoms. She will follow up in 1 year. She verbalizes understanding and agreement with this plan.   No orders of the defined types were placed in this encounter.    Meds ordered this encounter  Medications  . acetaZOLAMIDE (DIAMOX) 250 MG tablet    Sig: Take 2 tablets (500 mg total)  by mouth 2 (two) times daily.    Dispense:  120 tablet    Refill:  11    Order Specific Question:   Supervising Provider    Answer:   Anson Fret [id number]      I spent 20 minutes with the patient. 50% of this time was spent counseling and educating patient on plan of care and medications.     Shawnie Dapper, FNP-C 01/10/2021, 2:19 PM [facility] [address], [address] [place], [place] [address] [phone]

## 2021-03-12 ENCOUNTER — Encounter: Payer: Self-pay | Admitting: Family Medicine

## 2021-03-12 ENCOUNTER — Other Ambulatory Visit: Payer: Self-pay

## 2021-03-12 ENCOUNTER — Ambulatory Visit (INDEPENDENT_AMBULATORY_CARE_PROVIDER_SITE_OTHER): Payer: Managed Care, Other (non HMO) | Admitting: Family Medicine

## 2021-03-12 VITALS — BP 123/85 | HR 76 | Ht 64.0 in | Wt 151.2 lb

## 2021-03-12 DIAGNOSIS — Z1211 Encounter for screening for malignant neoplasm of colon: Secondary | ICD-10-CM | POA: Diagnosis not present

## 2021-03-12 DIAGNOSIS — Z131 Encounter for screening for diabetes mellitus: Secondary | ICD-10-CM | POA: Insufficient documentation

## 2021-03-12 DIAGNOSIS — Z1231 Encounter for screening mammogram for malignant neoplasm of breast: Secondary | ICD-10-CM | POA: Diagnosis not present

## 2021-03-12 DIAGNOSIS — Z13220 Encounter for screening for lipoid disorders: Secondary | ICD-10-CM | POA: Diagnosis not present

## 2021-03-12 DIAGNOSIS — Z1159 Encounter for screening for other viral diseases: Secondary | ICD-10-CM | POA: Insufficient documentation

## 2021-03-12 LAB — POCT GLYCOSYLATED HEMOGLOBIN (HGB A1C): Hemoglobin A1C: 5.2 % (ref 4.0–5.6)

## 2021-03-12 NOTE — Patient Instructions (Signed)
Thank you for coming in to see Joy Gentry today! Please see below to review our plan for today's visit:  1. We are checking your Cholesterol and Diabetes levels today. We will follow up with these results.  2. I have placed a referral for mammogram and colonoscopy. Please follow up with these!   Please call the clinic at [phone] if your symptoms worsen or you have any concerns. It was our pleasure to serve you!   Dr. Peggyann Shoals [facility]

## 2021-03-12 NOTE — Progress Notes (Signed)
    SUBJECTIVE:   CHIEF COMPLAINT / HPI:   Comprehensive Physical  Exercise - exercises 3 times weekly Smoking - never smoker  Due for: -Mammogram (order placed) -Colonoscopy (referral to GI placed), patient is wondering if she can have mammogram and colonoscopy done in [place], [place], where she is currently staying to care for her parents -Hep C screen (done at today's visit) -A1c (performed at today's visit, normal) -Lipid panel (performed at today's visit, shows normal results except LDL is slightly increased at 109, improved from previous value of 125, ASCVD risk 1.3%) -COVID Booster (respectfully declined) -Patient is not due for Pap smear until 2024   PERTINENT  PMH / PSH:  Patient Active Problem List   Diagnosis Date Noted  . Colon cancer screening 03/12/2021  . Breast cancer screening by mammogram 03/12/2021  . Diabetes mellitus screening 03/12/2021  . Encounter for screening for lipid disorder 03/12/2021  . Encounter for hepatitis C screening test for low risk patient 03/12/2021  . COVID-19 virus infection 08/28/2019  . Family history of heart disease 02/04/2018  . Hyperlipidemia 02/04/2018  . Birth control counseling 03/06/2017  . Pseudotumor cerebri 06/02/2015  . 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 History of Chiari malformation 05/29/2015  . Well woman exam 12/02/2012  . ANXIETY 12/18/2006     OBJECTIVE:   BP 123/85   Pulse 76   Ht 5\' 4"  (1.626 m)   Wt 151 lb 3.2 oz (68.6 kg)   SpO2 (!) 63%   BMI 25.95 kg/m  -Patient SPO2 noted to be 63%, patient is wearing long nails which could be skewing test, patient with comfortable work of breathing on room air and moving air well on physical exam  Physical exam: General: Well-appearing patient, no apparent distress Respiratory: CTA bilaterally, comfortable work of breathing Cardio: RRR, S1-S2 present, no murmurs appreciated Abdomen: Normal bowel sounds appreciated   ASSESSMENT/PLAN:   Breast cancer screening by  mammogram - Order placed for mammogram  Colon cancer screening -Referral made to GI for colonoscopy  Diabetes mellitus screening A1c performed at today's visit, normal  Encounter for hepatitis C screening test for low risk patient -Hep C antibody screening performed at today's visit, normal  Encounter for screening for lipid disorder Lipid panel rechecked at today's visit, LDL improved from 125 down to 109, patient's ASCVD risk 1.3%, no statin indicated at this time     , DO [facility]

## 2021-03-13 ENCOUNTER — Telehealth: Payer: Self-pay | Admitting: Family Medicine

## 2021-03-13 LAB — LIPID PANEL
Chol/HDL Ratio: 2.8 ratio (ref 0.0–4.4)
Cholesterol, Total: 183 mg/dL (ref 100–199)
HDL: 66 mg/dL (ref >39)
LDL Chol Calc (NIH): 109 mg/dL — ABNORMAL HIGH (ref 0–99)
Triglycerides: 39 mg/dL (ref 0–149)
VLDL Cholesterol Cal: 8 mg/dL (ref 5–40)

## 2021-03-13 LAB — HEPATITIS C ANTIBODY: Hep C Virus Ab: <0.1 s/co ratio (ref 0.0–0.9)

## 2021-03-13 NOTE — Assessment & Plan Note (Signed)
Lipid panel rechecked at today's visit, LDL improved from 125 down to 109, patient's ASCVD risk 1.3%, no statin indicated at this time

## 2021-03-13 NOTE — Assessment & Plan Note (Signed)
Order placed for mammogram.

## 2021-03-13 NOTE — Assessment & Plan Note (Signed)
A1c performed at today's visit, normal

## 2021-03-13 NOTE — Assessment & Plan Note (Signed)
Referral made to GI for colonoscopy

## 2021-03-13 NOTE — Telephone Encounter (Signed)
Patient is calling and would like to schedule to have her hepatitis vaccine. She had a physical with Dr. Dareen Piano yesterday 03/13/21 and said she was suppose to get it at that appointment and did not. I just wanted to clarify what vaccine is needed.   Please advise.

## 2021-03-13 NOTE — Assessment & Plan Note (Signed)
-  Hep C antibody screening performed at today's visit, normal

## 2021-03-16 ENCOUNTER — Encounter: Payer: Self-pay | Admitting: Family Medicine

## 2021-03-29 ENCOUNTER — Encounter: Payer: Self-pay | Admitting: Family Medicine

## 2021-06-22 ENCOUNTER — Other Ambulatory Visit: Payer: Self-pay

## 2021-06-22 ENCOUNTER — Ambulatory Visit
Admission: RE | Admit: 2021-06-22 | Discharge: 2021-06-22 | Disposition: A | Discharge status: Home / Self Care | Payer: Managed Care, Other (non HMO) | Source: Ambulatory Visit | Attending: Family Medicine | Admitting: Family Medicine

## 2021-06-22 DIAGNOSIS — Z1231 Encounter for screening mammogram for malignant neoplasm of breast: Secondary | ICD-10-CM

## 2022-02-18 ENCOUNTER — Telehealth: Payer: Self-pay | Admitting: Family Medicine

## 2022-02-18 NOTE — Telephone Encounter (Signed)
Pt requesting refill of acetaZOLAMIDE (DIAMOX) 250 MG tablet at [facility] ?[address], [place] [address] ?Phone: [phone] ?

## 2022-02-18 NOTE — Telephone Encounter (Signed)
Called the pt back. She has not had a ov since 12/2020. Advised I saw where they got her in aug scheduled but due to needing to see you once a yr we needed to work in sooner. Was able to have the apt the moved up to a VV on next tues 02/26/2022 at 7:45 am. ?

## 2022-02-26 ENCOUNTER — Telehealth (INDEPENDENT_AMBULATORY_CARE_PROVIDER_SITE_OTHER): Payer: Managed Care, Other (non HMO) | Admitting: Family Medicine

## 2022-02-26 ENCOUNTER — Encounter: Payer: Self-pay | Admitting: Family Medicine

## 2022-02-26 DIAGNOSIS — H4710 Unspecified papilledema: Secondary | ICD-10-CM

## 2022-02-26 DIAGNOSIS — G932 Benign intracranial hypertension: Secondary | ICD-10-CM | POA: Diagnosis not present

## 2022-02-26 MED ORDER — ACETAZOLAMIDE 250 MG PO TABS: 500.0000 mg | ORAL_TABLET | Freq: Two times a day (BID) | ORAL | 11 refills | Status: DC

## 2022-02-26 NOTE — Progress Notes (Signed)
? ?PATIENT: Joy Gentry ?DOB: 11/12/1970 ? ?REASON FOR VISIT: follow up ?HISTORY FROM: patient ? ?Virtual Visit via Telephone Note ? ?I connected with Joy Gentry on 02/26/22 at  7:45 AM EDT by telephone and verified that I am speaking with the correct person using two identifiers. ?  ?I discussed the limitations, risks, security and privacy concerns of performing an evaluation and management service by telephone and the availability of in person appointments. I also discussed with the patient that there may be a patient responsible charge related to this service. The patient expressed understanding and agreed to proceed. ? ? ?History of Present Illness: ? ?02/26/22 ALL ([place]): ?Joy Gentry is a 51 y.o. female here today for follow up for IIH. She continues acetazolamide 500mg  BID. She is doing well. She is not having any headaches. No vision changes. She is scheduled for an eye exam next month. She is trying to do better with taking medication consistently. She does well until she is due for a refill. Sometimes it is 1-2 weeks before she gets a refill. She prefers 30 day refills.  ? ? ?01/10/21 ALL:  ?She returns for follow up for IIH. Last seen 08/2019 and advised to increase Diamox to 500mg  BID due to continued papilledema on ophthalmology exam in 07/2019. She had increased dose but had a period of time where she could not afford her medication so she decreased dose to make it last. She has been back on 500mg  twice daily for the past 2-3 months. Eye exam was stable with no significant changes but no improvement.  ?  ?09/13/2019 ALL:  ?Joy Gentry is a 51 y.o. female here today for follow up of IIH. Diamox was increased to 500mg  twice daily following report from ophthalmology in 07/2019 concerning for continued papilledema. She increased dose to 250mg  in am and 500mg  in the evenings. She was concerned about taking 500mg  twice daily. She wanted to talk to [place] before increasing  this dose.  She denies any side effects.  She reports that headaches have completely resolved.  No visual changes or concerns.  She is uncertain of ophthalmology's recommendation for follow-up. ? ? ?Observations/Objective: ? ?Generalized: Well developed, in no acute distress  ?Mentation: Alert oriented to time, place, history taking. Follows all commands speech and language fluent ? ? ?Assessment and Plan: ? ?51 y.o. year old female  has a past medical history of Vision abnormalities. here with ? ?  ICD-10-CM   ?1. Pseudotumor cerebri  G93.2 acetaZOLAMIDE (DIAMOX) 250 MG tablet  ?  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  ? ? ?Hawley is doing well. She will continue acetazolamide 500mg  twice daily. I have encouraged her to consider 90 day refills to aid in consistency with dosing. She will have eye exam sent to me once completed. Healthy lifestyle habits encouraged. She will follow up in 1 year, sooner if needed.   ? ?No orders of the defined types were placed in this encounter. ? ? ?Meds ordered this encounter  ?Medications  ? acetaZOLAMIDE (DIAMOX) 250 MG tablet  ?  Sig: Take 2 tablets (500 mg total) by mouth 2 (two) times daily.  ?  Dispense:  120 tablet  ?  Refill:  11  ?  Patient prefers 30 day prescription  ?  Order Specific Question:   Supervising Provider  ?  AnswerMelvenia Beam [id number]  ? ? ? ?Follow Up Instructions: ? ?I discussed the  assessment and treatment plan with the patient. The patient was provided an opportunity to ask questions and all were answered. The patient agreed with the plan and demonstrated an understanding of the instructions. ?  ?The patient was advised to call back or seek an in-person evaluation if the symptoms worsen or if the condition fails to improve as anticipated. ? ?I provided 15 minutes of non-face-to-face time during this encounter. Patient located at their place of residence during [place] visit. Provider is in the office.  ? ? ?Debbora Presto, NP  ?

## 2022-02-26 NOTE — Patient Instructions (Addendum)
Below is our plan: ? ?We will continue acetazolamide 500mg  twice daily. Consider 90 day prescription to help with consistency.  ? ?Please make sure you are staying well hydrated. I recommend 50-60 ounces daily. Well balanced diet and regular exercise encouraged. Consistent sleep schedule with 6-8 hours recommended.  ? ?Please continue follow up with care team as directed.  ? ?Follow up with me in 1 year  ? ?You may receive a survey regarding today's visit. I encourage you to leave honest feed back as I do use this information to improve patient care. Thank you for seeing me today!  ? ? ?

## 2022-03-26 ENCOUNTER — Encounter: Payer: Self-pay | Admitting: *Deleted

## 2022-06-12 ENCOUNTER — Ambulatory Visit: Payer: Managed Care, Other (non HMO) | Admitting: Family Medicine

## 2022-06-14 ENCOUNTER — Other Ambulatory Visit: Payer: Self-pay | Admitting: Family Medicine

## 2022-06-14 DIAGNOSIS — Z1231 Encounter for screening mammogram for malignant neoplasm of breast: Secondary | ICD-10-CM

## 2022-07-05 ENCOUNTER — Ambulatory Visit
Admission: RE | Admit: 2022-07-05 | Discharge: 2022-07-05 | Disposition: A | Discharge status: Home / Self Care | Payer: Managed Care, Other (non HMO) | Source: Ambulatory Visit | Attending: Family Medicine | Admitting: Family Medicine

## 2022-07-05 ENCOUNTER — Ambulatory Visit: Payer: Managed Care, Other (non HMO)

## 2022-07-05 ENCOUNTER — Ambulatory Visit (INDEPENDENT_AMBULATORY_CARE_PROVIDER_SITE_OTHER): Payer: Managed Care, Other (non HMO) | Admitting: Student

## 2022-07-05 ENCOUNTER — Encounter: Payer: Self-pay | Admitting: Student

## 2022-07-05 VITALS — BP 106/85 | HR 65 | Wt 149.0 lb

## 2022-07-05 DIAGNOSIS — Z1231 Encounter for screening mammogram for malignant neoplasm of breast: Secondary | ICD-10-CM

## 2022-07-05 DIAGNOSIS — Z13228 Encounter for screening for other metabolic disorders: Secondary | ICD-10-CM | POA: Diagnosis not present

## 2022-07-05 DIAGNOSIS — Z0000 Encounter for general adult medical examination without abnormal findings: Secondary | ICD-10-CM | POA: Diagnosis not present

## 2022-07-05 DIAGNOSIS — Z1211 Encounter for screening for malignant neoplasm of colon: Secondary | ICD-10-CM

## 2022-07-05 LAB — POCT GLYCOSYLATED HEMOGLOBIN (HGB A1C): Hemoglobin A1C: 5.1 % (ref 4.0–5.6)

## 2022-07-05 NOTE — Progress Notes (Unsigned)
    SUBJECTIVE:   Chief compliant/HPI: annual examination  Joy Gentry is a 51 y.o. who presents today for an annual exam.   Only concern today was a few weeks ago had a strain of her left shoulder where she had decreased ROM. She has full ROM today and is unsure what caused it.  History tabs reviewed and updated.   Review of systems form reviewed and notable for never smoker, does not drink.   OBJECTIVE:   BP 106/85   Pulse 65   Wt 149 lb (67.6 kg)   SpO2 100%   BMI 25.58 kg/m   General: Well appearing, NAD, awake, alert, responsive to questions Head: Normocephalic atraumatic, neck supple no masses, TM normal bilaterally CV: Regular rate and rhythm no murmurs rubs or gallops, 2+ radial pulses Respiratory: Clear to ausculation bilaterally, no wheezes rales or crackles, chest rises symmetrically,  no increased work of breathing Abdomen: Soft, non-tender, non-distended, normoactive bowel sounds  Extremities: Moves upper and lower extremities freely, no edema in LE, full ROM of shoulders without pain Neuro: No focal deficits Skin: No rashes or lesions visualized   ASSESSMENT/PLAN:   No problem-specific Assessment & Plan notes found for this encounter.   Annual Examination  Menopausal See AVS for age appropriate recommendations  PHQ score 1, reviewed and discussed.  BP reviewed and at goal .  Asked about intimate partner violence and resources given as appropriate   Considered the following items based upon USPSTF recommendations: Diabetes screening: ordered Screening for elevated cholesterol: ordered HIV testing: discussed Hepatitis C: discussed Hepatitis B: discussed Syphilis if at high risk: discussed GC/CT not at high risk and not ordered. Osteoporosis screening considered based upon risk of fracture from [facility] calculator. Major osteoporotic fracture risk is 4.4%. DEXA not ordered.  Reviewed risk factors for latent tuberculosis and not  indicated   Discussed family history, BRCA testing not indicated.  Cervical cancer screening: prior Pap reviewed, repeat due in 3 years, ASCUS Breast cancer screening: discussed potential benefits, risks including overdiagnosis and biopsy, elected proceed with mammogram got one today Colorectal cancer screening: discussed, colonoscopy ordered Lung cancer screening: discussed-never smoker  Encounter for screening for other metabolic disorders - CBC - Comprehensive metabolic panel - TSH - HgB A1c - Lipid Panel  Screening for colon cancer - Ambulatory referral to Gastroenterology   Follow up in 1 year or sooner if indicated.    Gerrit Heck, MD [place]

## 2022-07-05 NOTE — Patient Instructions (Signed)
It was great to see you! Thank you for allowing me to participate in your care!   I recommend that you always bring your medications to each appointment as this makes it easy to ensure we are on the correct medications and helps [place] not miss when refills are needed.  Our plans for today:  Today at your annual preventive visit we talked about the following measures:   I recommend 150 minutes of exercise per week-try 30 minutes 5 days per week We discussed reducing sugary beverages (like soda and juice) and increasing leafy greens and whole fruits.  We discussed avoiding tobacco and alcohol.  I recommend avoiding illicit substances.  Your blood pressure is at goal.    I have referred you for a colonoscopy as well  We are checking some labs today, I will call you if they are abnormal will send you a MyChart message or a letter if they are normal.  If you do not hear about your labs in the next 2 weeks please let us know.  Take care and seek immediate care sooner if you develop any concerns. Please remember to show up 15 minutes before your scheduled appointment time!  Levin Erp, MD [facility]

## 2022-07-06 LAB — COMPREHENSIVE METABOLIC PANEL
ALT: 19 IU/L (ref 0–32)
AST: 19 IU/L (ref 0–40)
Albumin/Globulin Ratio: 1.7 (ref 1.2–2.2)
Albumin: 4.6 g/dL (ref 3.8–4.9)
Alkaline Phosphatase: 67 IU/L (ref 44–121)
BUN/Creatinine Ratio: 16 (ref 9–23)
BUN: 13 mg/dL (ref 6–24)
Bilirubin Total: 0.7 mg/dL (ref 0.0–1.2)
CO2: 22 mmol/L (ref 20–29)
Calcium: 9.4 mg/dL (ref 8.7–10.2)
Chloride: 105 mmol/L (ref 96–106)
Creatinine, Ser: 0.83 mg/dL (ref 0.57–1.00)
Globulin, Total: 2.7 g/dL (ref 1.5–4.5)
Glucose: 85 mg/dL (ref 70–99)
Potassium: 3.8 mmol/L (ref 3.5–5.2)
Sodium: 141 mmol/L (ref 134–144)
Total Protein: 7.3 g/dL (ref 6.0–8.5)
eGFR: 85 mL/min/{1.73_m2} (ref >59)

## 2022-07-06 LAB — LIPID PANEL
Chol/HDL Ratio: 2.8 ratio (ref 0.0–4.4)
Cholesterol, Total: 204 mg/dL — ABNORMAL HIGH (ref 100–199)
HDL: 72 mg/dL (ref >39)
LDL Chol Calc (NIH): 123 mg/dL — ABNORMAL HIGH (ref 0–99)
Triglycerides: 50 mg/dL (ref 0–149)
VLDL Cholesterol Cal: 9 mg/dL (ref 5–40)

## 2022-07-06 LAB — CBC
Hematocrit: 37.5 % (ref 34.0–46.6)
Hemoglobin: 12.5 g/dL (ref 11.1–15.9)
MCH: 30.5 pg (ref 26.6–33.0)
MCHC: 33.3 g/dL (ref 31.5–35.7)
MCV: 92 fL (ref 79–97)
Platelets: 270 10*3/uL (ref 150–450)
RBC: 4.1 x10E6/uL (ref 3.77–5.28)
RDW: 11.8 % (ref 11.7–15.4)
WBC: 4.5 10*3/uL (ref 3.4–10.8)

## 2022-07-06 LAB — TSH: TSH: 2.61 u[IU]/mL (ref 0.450–4.500)

## 2022-07-10 ENCOUNTER — Telehealth: Payer: Self-pay | Admitting: Family Medicine

## 2022-07-10 NOTE — Telephone Encounter (Signed)
Clarified w/ [employer]. Ophthalmology was supposed to fax notes to our office. I will have Hilda Blades in medical records f/u with pt to get a copy of notes sent to [place]

## 2022-07-10 NOTE — Telephone Encounter (Signed)
Pt is calling. Stated she want some results from her Ophthalmologist. Pt is requesting a call back.

## 2022-07-11 ENCOUNTER — Encounter: Payer: Self-pay | Admitting: Student

## 2022-07-11 NOTE — Telephone Encounter (Signed)
Received notes-giving to POD 1 for review

## 2022-07-29 ENCOUNTER — Encounter: Payer: Self-pay | Admitting: Neurology

## 2022-07-29 ENCOUNTER — Telehealth: Payer: Self-pay | Admitting: *Deleted

## 2022-07-29 NOTE — Telephone Encounter (Signed)
I called pt, request made today to Dr Peter Garter office.

## 2022-07-29 NOTE — Telephone Encounter (Signed)
Sent a Pharmacist, community message advising the patient that we have received the report and will have Dr Felecia Shelling review upon return to work

## 2022-07-29 NOTE — Telephone Encounter (Signed)
Hilda Blades, can you let pt know we are requesting another copy since Dr. Felecia Shelling out to verify if he has it? We will request another copy just in case for MD when he returns next week.

## 2022-07-29 NOTE — Telephone Encounter (Signed)
I called pt she gave me Dr Peter Garter number to request testing to have fax to our office, for the doctor to review. I left a message for the office to give me call [phone]

## 2022-07-29 NOTE — Telephone Encounter (Signed)
Pt calling to make sure [facility] have received the results from the ophthalmologist. Would like a call from the nurse. Have reached out to Medical Records for a copy.

## 2022-08-08 ENCOUNTER — Other Ambulatory Visit: Payer: Self-pay | Admitting: Student

## 2022-08-08 DIAGNOSIS — Z13228 Encounter for screening for other metabolic disorders: Secondary | ICD-10-CM

## 2022-09-26 ENCOUNTER — Telehealth: Payer: Self-pay | Admitting: Family Medicine

## 2022-09-26 NOTE — Telephone Encounter (Signed)
Called pt back. Relayed AL,NP recommendations. She is agreeable to plan. She may take 250mg  po BID x2 months instead of a month to see how she does. If doing well, she will then go to 250mg  po qdx2 wk then stop. She will keep updated on how things go.

## 2022-09-26 NOTE — Telephone Encounter (Signed)
Pt called wanting to know since her Test results have been same a few years from here and her eye doctor, she would like to discuss decreasing the dosage on her acetaZOLAMIDE (DIAMOX) 250 MG tablet. Please advise.

## 2022-10-28 NOTE — Telephone Encounter (Signed)
Pt is calling. Stated she has went down to 500mg  a day because of pressure in her neck and shoulders. Stated she is taking one pill during the day and two at night. Stated the pressure have stopped.

## 2022-10-29 NOTE — Telephone Encounter (Signed)
Per Amy, NP: " Can you guys triage and see if she is requesting to stay at current dose of 250mg  in am and 500mg  at bedtime. If pressure if better with this dose we can continue. If she wishes to wean we can continue and I can send her weaning instructions. I would decrease dose to 250mg  BID for 2 weeks then decrease to 250mg  daily for 2 weeks then stop.    I called patient. She is taking diamox 500mg  in AM and  250mg  in the PM. She feels like she is doing okay at this dosing. She will continue this for the next month and then let us know if she decides she would like to reduce it further.

## 2023-01-06 ENCOUNTER — Other Ambulatory Visit: Payer: Self-pay | Admitting: Family Medicine

## 2023-01-06 DIAGNOSIS — H4710 Unspecified papilledema: Secondary | ICD-10-CM

## 2023-01-06 DIAGNOSIS — G932 Benign intracranial hypertension: Secondary | ICD-10-CM

## 2023-01-06 NOTE — Telephone Encounter (Signed)
Video visit on 02/26/2022 No follow up visit scheduled

## 2023-01-23 ENCOUNTER — Other Ambulatory Visit: Payer: Self-pay | Admitting: Family Medicine

## 2023-01-23 DIAGNOSIS — G932 Benign intracranial hypertension: Secondary | ICD-10-CM

## 2023-01-23 DIAGNOSIS — H4710 Unspecified papilledema: Secondary | ICD-10-CM

## 2023-01-27 ENCOUNTER — Telehealth: Payer: Self-pay | Admitting: Family Medicine

## 2023-01-27 NOTE — Telephone Encounter (Signed)
Called pt back to further discuss. She is followed for Pseudotumor cerebri/Papilledema. Confirmed she is taking Diamox 250mg  in AM and 500mg  in the PM. She is tolerating well. Last seen 02/26/22 for mychart VV and has no f/u scheduled at this time.  Physically she is ok, but stress over the years (not caring for herself well) has affected her.   She takes calls for her job all day. Wondering if she can file for partial disability. Advised I am not sure if she can do partial. She may want to check with employer on this. She is hesitant to bring up disability questions with employer in fear of being fired. She will wait to discuss w/ AL,NP.  Scheduled mychart VV for 04/29/23 at 9:15am and added to wait list.

## 2023-01-27 NOTE — Telephone Encounter (Signed)
Pt called stated she is having some stressful issues that has came up and would like to talk to nurse. Pt would not give me more details.

## 2023-02-20 ENCOUNTER — Telehealth: Payer: Self-pay | Admitting: Family Medicine

## 2023-02-20 NOTE — Telephone Encounter (Signed)
Pt is asking for a call from RN to discuss what she is going thru , and wants to discuss pursuing FMLA thru her employer for this neurologic issue.

## 2023-02-20 NOTE — Telephone Encounter (Signed)
Called pt. She does not want to file for disability. She would prefer to file for FMLA. She is able to work/function okay. She spoke w/ Production designer, theatre/television/film and aware there are some days sx worse than others. She is unable to tolerate taking calls all day as well as she used to. She experiences stiffness in shoulders/neck and headache intermittently. Wants to lay in bed at that point and not do anything. Medication helps w/ sx.   I scheduled sooner appt with AL,NP for 03/03/23 at 8am. She will bring copy of FMLA form with her. Cx appt previously made for 04/29/23.    Per previous phone note from 01/27/23:  "Called pt back to further discuss. She is followed for Pseudotumor cerebri/Papilledema. Confirmed she is taking Diamox 250mg  in AM and 500mg  in the PM. She is tolerating well. Last seen 02/26/22 for mychart VV and has no f/u scheduled at this time.  Physically she is ok, but stress over the years (not caring for herself well) has affected her. She takes calls for her job all day. Wondering if she can file for partial disability. Advised I am not sure if she can do partial. She may want to check with employer on this. She is hesitant to bring up disability questions with employer in fear of being fired. She will wait to discuss w/ AL,NP.   Scheduled mychart VV for 04/29/23 at 9:15am and added to wait list."

## 2023-02-26 NOTE — Progress Notes (Deleted)
PATIENT: Joy Gentry DOB: 1970-12-14  REASON FOR VISIT: follow up HISTORY FROM: patient  No chief complaint on file.    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 She was last seen 02/2022 and doing well.   02/26/22 ALL (Mychart): Joy Gentry is a 52 y.o. female here today for follow up for IIH. She continues acetazolamide 500mg  BID. She is doing well. She is not having any headaches. No vision changes. She is scheduled for an eye exam next month. She is trying to do better with taking medication consistently. She does well until she is due for a refill. Sometimes it is 1-2 weeks before she gets a refill. She prefers 30 day refills.   01/10/2021 ALL: She returns for follow up for IIH. Last seen 08/2019 and advised to increase Diamox to 500mg  BID due to continued papilledema on ophthalmology exam in 07/2019. She had increased dose but had a period of time where she could not afford her medication so she decreased dose to make it last. She has been back on 500mg  twice daily for the past 2-3 months. Eye exam was stable with no significant changes but no improvement.   09/13/2019 ALL:  Joy Gentry is a 52 y.o. female here today for follow up of IIH. Diamox was increased to 500mg  twice daily following report from ophthalmology in 07/2019 concerning for continued papilledema. She increased dose to 250mg  in am and 500mg  in the evenings. She was concerned about taking 500mg  twice daily. She wanted to talk to [place] before increasing this dose.  She denies any side effects.  She reports that headaches have completely resolved.  No visual changes or concerns.  She is uncertain of ophthalmology's recommendation for follow-up.  HISTORY: (copied from [employer] note on 04/29/2018)  Joy Gentry is a 52 y.o. 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    She stated that for the last several years she started to feel pressure-like feeling in the back of her head and upper neck, this feeling comes and goes, happens every week lasting hours. For the last 1 year also she started to have episodic vision changes, especially in the morning, with vision suddenly goes greyish for several seconds. It happens once or twice a months. She denies any frank headache, patient loss, nausea vomiting, loss of consciousness. She admitted that she normally had photophobia and phonophobia, which has been for her long time. She went to see her ophthalmologist Dr. Sherryle Lis for annual check 2 weeks ago, was found to have 3+ bilateral papilledema, concerning for pseudotumor cerebri, and referred her for neurology consult.   She stated that about 27 years ago she had headache, mild right arm weakness, and mild problem with coordination. 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 However she cannot remember what the medical term of that condition. She stated that after surgery or her symptoms were gone.   She denies any history of migraine, hypertension, diabetes, hyperlipidemia. She stated that she gained 10 pounds this year, and now she is 170 pounds. She also admit that she also bathroom a lot.   She denies smoking, drinking or illicit drugs. She works in [employer], stressful job, she also experiences recent years  of anxiety due to being single mom, insurance problems among others. 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   02/05/17 follow up - pt had LP on 06/13/15 and OP was 20cm H2O and CP was 3cm H2O. CSF unremarkable except WBC 11. She is asymptomatic clinically, no concern for  CNS infection. CSF culture negative. She was put on diamox and titrated up to 500mg  bid. Complaint of drowsiness during the day, concerning for side effect from Diamox and put her on 500 mg Diamox at night and decreased morning dose of Diamox from 500 to 250. Tolerating well. She moved to [place], [place] in 09/2015, but did not follow neurology there. She followed with ophthalmology and continued to see improvement of her fundi exam and recommended to stop diamox due to concern of kidney function with long term use of diamox. However, she does not feel comfortable to be off diamox completely at that time. After discussion, we decreased her dose to 250mg . In 04/2016, she lost her insurance so her diamox was stopped by her own. Although she was asymptomatic, recent eye exam on 01/28/17 showed bilateral disc margin indistinct, and Dr. Laural Benes mentioned "swelling is noticeable", and recommend to re-institute therapy. She denies any HA, vision disturbance. She has new prescription glass to be ready next week.    Interval history:11/08/17 Dr. Roda Shutters During the interval time, pt has been doing well. No visual disturbance, no HA. She still on diamox 250mg  bid. She followed up with Dr. Sherryle Lis on 09/19/17 and check fundi showed "bilateral disc margin is indistinct, disc size is large, there are breaks in the nerve fiber layer that do not extend back to the optic nerve margin". But no papilledema mentioned.    UPDATE 7/10/2019CM Joy Gentry, 52 year old female returns for follow-up with history of papilledema.  She is currently on Diamox 250 twice daily.  She had eye exam today at Dr. Henriette Combs.  Visual fields unchanged and stable.  She will follow-up yearly for visual field testing.  She denies any headache or visual disturbance.  She is due for routine labs through primary care.  She returns for reevaluation     REVIEW OF SYSTEMS: Out of a complete 14 system review of symptoms, the patient complains only of the following  symptoms, frequency of urination and all other reviewed systems are negative.  ALLERGIES: No Known Allergies  HOME MEDICATIONS: Outpatient Medications Prior to Visit  Medication Sig Dispense Refill   acetaZOLAMIDE (DIAMOX) 250 MG tablet TAKE 2 TABLETS BY MOUTH 2 TIMES DAILY. 360 tablet 0   Prenatal Multivit-Min-Fe-FA (PRENATAL VITAMINS PO) Take by mouth.     vitamin B-12 (CYANOCOBALAMIN) 100 MCG tablet Take 100 mcg by mouth daily.     No facility-administered medications prior to visit.    PAST MEDICAL HISTORY: Past Medical History:  Diagnosis Date   Vision abnormalities     Papilledema    PAST SURGICAL HISTORY: Past Surgical History:  Procedure Laterality Date   neck sugery     oophrectomy Left     FAMILY HISTORY: Family History  Problem Relation Age of Onset   Hypertension Mother    Hypertension Father     SOCIAL HISTORY: Social History   Socioeconomic History   Marital status: Married    Spouse name: Not on file   Number of children: Not on file   Years  of education: Not on file   Highest education level: Not on file  Occupational History   Not on file  Tobacco Use   Smoking status: Never   Smokeless tobacco: Never  Substance and Sexual Activity   Alcohol use: No   Drug use: No   Sexual activity: Yes  Other Topics Concern   Not on file  Social History Narrative   Not on file   Social Determinants of Health   Financial Resource Strain: Not on file  Food Insecurity: Not on file  Transportation Needs: Not on file  Physical Activity: Not on file  Stress: Not on file  Social Connections: Not on file  Intimate Partner Violence: Not on file      PHYSICAL EXAM  There were no vitals filed for this visit.  There is no height or weight on file to calculate BMI.  Generalized: Well developed, in no acute distress  Cardiology: normal rate and rhythm, no murmur noted Neurological examination  Mentation: Alert oriented to time, place, history  taking. Follows all commands speech and language fluent Cranial nerve II-XII: Pupils were equal round reactive to light. Extraocular movements were full, visual field were full on confrontational test. Unable to visualize optic disc due to small pupils  Motor: The motor testing reveals 5 over 5 strength of all 4 extremities. Good symmetric motor tone is noted throughout.  Sensory: Sensory testing is intact to soft touch on all 4 extremities. No evidence of extinction is noted.  Coordination: Cerebellar testing reveals good finger-nose-finger and heel-to-shin bilaterally.  Gait and station: Gait is normal.   DIAGNOSTIC DATA (LABS, IMAGING, TESTING) - I reviewed patient records, labs, notes, testing and imaging myself where available.      No data to display           Lab Results  Component Value Date   WBC 4.5 07/05/2022   HGB 12.5 07/05/2022   HCT 37.5 07/05/2022   MCV 92 07/05/2022   PLT 270 07/05/2022      Component Value Date/Time   NA 141 07/05/2022 1656   K 3.8 07/05/2022 1656   CL 105 07/05/2022 1656   CO2 22 07/05/2022 1656   GLUCOSE 85 07/05/2022 1656   GLUCOSE 70 05/29/2015 1513   BUN 13 07/05/2022 1656   CREATININE 0.83 07/05/2022 1656   CREATININE 0.80 05/29/2015 1513   CALCIUM 9.4 07/05/2022 1656   PROT 7.3 07/05/2022 1656   ALBUMIN 4.6 07/05/2022 1656   AST 19 07/05/2022 1656   ALT 19 07/05/2022 1656   ALKPHOS 67 07/05/2022 1656   BILITOT 0.7 07/05/2022 1656   GFRNONAA 75 12/31/2019 1119   GFRAA 87 12/31/2019 1119   Lab Results  Component Value Date   CHOL 204 (H) 07/05/2022   HDL 72 07/05/2022   LDLCALC 123 (H) 07/05/2022   TRIG 50 07/05/2022   CHOLHDL 2.8 07/05/2022   Lab Results  Component Value Date   HGBA1C 5.1 07/05/2022   No results found for: "VITAMINB12" Lab Results  Component Value Date   TSH 2.610 07/05/2022     ASSESSMENT AND PLAN 52 y.o. year old female  has a past medical history of Vision abnormalities. here with   No  diagnosis found.    Mrs. Victory Gentry continues to tolerate Diamox well. She will continue Diamox 500mg  twice daily.  I have advised regular follow-up with ophthalmology.  Healthy lifestyle habits encouraged. She will call with any new or worsening symptoms. She will follow up in 1 year.  She verbalizes understanding and agreement with this plan.   No orders of the defined types were placed in this encounter.    No orders of the defined types were placed in this encounter.     I spent 20 minutes with the patient. 50% of this time was spent counseling and educating patient on plan of care and medications.     Shawnie Dapper, FNP-C 02/26/2023, 4:46 PM [facility] [address] [place], [place] [address] [phone]

## 2023-03-02 ENCOUNTER — Encounter: Payer: Self-pay | Admitting: Family Medicine

## 2023-03-03 ENCOUNTER — Ambulatory Visit: Payer: Managed Care, Other (non HMO) | Admitting: Family Medicine

## 2023-03-04 NOTE — Progress Notes (Unsigned)
PATIENT: Joy COGLIANESE DOB: 1970-12-01  REASON FOR VISIT: follow up HISTORY FROM: patient  Virtual Visit via Telephone Note  I connected with Joy Gentry on 03/06/23 at  9:30 AM EDT by telephone and verified that I am speaking with the correct person using two identifiers.   I discussed the limitations, risks, security and privacy concerns of performing an evaluation and management service by telephone and the availability of in person appointments. I also discussed with the patient that there may be a patient responsible charge related to this service. The patient expressed understanding and agreed to proceed.   History of Present Illness:  03/06/23 ALL (Mychart): Joy Gentry returns for follow up for IIH. She was last seen 02/2022 and doing well. We continued acetazolamide 250mg  in am and 500mg  in pm as headaches were well managed and discussed continued wean in the future. She attempted to reduce dose to 250mg  BID but became concerned about more tightness in shoulders so resumed 250/500. She called 01/2023 stating that headaches remained well managed, however, she was requesting disability due to increased stress at work. She was advised to speak with employer. She called back 02/2023 requesting to discuss FMLA.   Today, she reports doing fairly well. She denies headaches. No vision changes. She does have a couple of times a month where she feels more pressure in the back of her head. Also notes shoulder tension and feels that it is harder to concentrate. 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 She is able to use health and safety time. Eye exam is coming up.   02/26/2022 ALL (Mychart): Joy Gentry is a 52 y.o. female here today for follow up for IIH. She continues acetazolamide 500mg  BID. She is doing well. She is not having any headaches. No vision changes. She is scheduled for an eye exam next month. She is trying to do better with  taking medication consistently. She does well until she is due for a refill. Sometimes it is 1-2 weeks before she gets a refill. She prefers 30 day refills.   01/10/21 ALL:  She returns for follow up for IIH. Last seen 08/2019 and advised to increase Diamox to 500mg  BID due to continued papilledema on ophthalmology exam in 07/2019. She had increased dose but had a period of time where she could not afford her medication so she decreased dose to make it last. She has been back on 500mg  twice daily for the past 2-3 months. Eye exam was stable with no significant changes but no improvement.    09/13/2019 ALL:  Joy Gentry is a 52 y.o. female here today for follow up of IIH. Diamox was increased to 500mg  twice daily following report from ophthalmology in 07/2019 concerning for continued papilledema. She increased dose to 250mg  in am and 500mg  in the evenings. She was concerned about taking 500mg  twice daily. She wanted to talk to [place] before increasing this dose.  She denies any side effects.  She reports that headaches have completely resolved.  No visual changes or concerns.  She is uncertain of ophthalmology's recommendation for follow-up.   Observations/Objective:  Generalized: Well developed, in no acute distress  Mentation: Alert oriented to time, place, history taking. Follows all commands speech and language fluent   Assessment and Plan:  52 y.o. year old female  has a past medical history of Vision abnormalities. here with    ICD-10-CM   1. Pseudotumor cerebri  G93.2  Joy Gentry is doing well. She will continue acetazolamide 250mg  QAM and 500mg  QHS. We have discussed complementary therapy to aid with muscle tension. We have discussed FMLA and she will submit paperwork if needed. She will have eye exam sent to me once completed. Healthy lifestyle habits encouraged. She will follow up in 1 year, sooner if needed.    No orders of the defined types were placed in this  encounter.   No orders of the defined types were placed in this encounter.    Follow Up Instructions:  I discussed the assessment and treatment plan with the patient. The patient was provided an opportunity to ask questions and all were answered. The patient agreed with the plan and demonstrated an understanding of the instructions.   The patient was advised to call back or seek an in-person evaluation if the symptoms worsen or if the condition fails to improve as anticipated.  I provided 15 minutes of non-face-to-face time during this encounter. Patient located at their place of residence during Mychart visit. Provider is in the office.    Shawnie Dapper, NP

## 2023-03-04 NOTE — Patient Instructions (Signed)
Below is our plan:  We will continue acetazolamide 250mg  in the mornings and 500mg  at bedtime. I am happy to help with FMLA paperwork if you wish.   Please make sure you are staying well hydrated. I recommend 50-60 ounces daily. Well balanced diet and regular exercise encouraged. Consistent sleep schedule with 6-8 hours recommended.   Please continue follow up with care team as directed.   Follow up with me in 1 year  You may receive a survey regarding today's visit. I encourage you to leave honest feed back as I do use this information to improve patient care. Thank you for seeing me today!

## 2023-03-06 ENCOUNTER — Telehealth (INDEPENDENT_AMBULATORY_CARE_PROVIDER_SITE_OTHER): Payer: Managed Care, Other (non HMO) | Admitting: Family Medicine

## 2023-03-06 ENCOUNTER — Encounter: Payer: Self-pay | Admitting: Family Medicine

## 2023-03-06 DIAGNOSIS — G932 Benign intracranial hypertension: Secondary | ICD-10-CM | POA: Diagnosis not present

## 2023-04-16 ENCOUNTER — Encounter: Payer: Self-pay | Admitting: Family Medicine

## 2023-04-16 ENCOUNTER — Telehealth: Payer: Self-pay | Admitting: Family Medicine

## 2023-04-16 ENCOUNTER — Encounter: Payer: Self-pay | Admitting: Student

## 2023-04-16 NOTE — Telephone Encounter (Signed)
Pt stated she needs to talk to nurse ablout FMLA and short term disability. Pt requesting a call back from nurse.

## 2023-04-16 NOTE — Telephone Encounter (Signed)
Stanton Kidney- can you call pt back? Per Amy's last note, she agreed to do FMLA for pt if she wished.

## 2023-04-17 ENCOUNTER — Encounter: Payer: Self-pay | Admitting: Student

## 2023-04-18 ENCOUNTER — Encounter: Payer: Self-pay | Admitting: Internal Medicine

## 2023-04-21 NOTE — Progress Notes (Unsigned)
    SUBJECTIVE:   CHIEF COMPLAINT / HPI:   ***  PERTINENT  PMH / PSH: ***  OBJECTIVE:   There were no vitals taken for this visit.  ***  ASSESSMENT/PLAN:   No problem-specific Assessment & Plan notes found for this encounter.     Amoy Steeves, MD [facility]  

## 2023-04-22 ENCOUNTER — Ambulatory Visit (INDEPENDENT_AMBULATORY_CARE_PROVIDER_SITE_OTHER): Payer: Managed Care, Other (non HMO) | Admitting: Student

## 2023-04-22 ENCOUNTER — Encounter: Payer: Self-pay | Admitting: Student

## 2023-04-22 VITALS — BP 95/72 | HR 75 | Ht 64.0 in | Wt 154.2 lb

## 2023-04-22 DIAGNOSIS — G44219 Episodic tension-type headache, not intractable: Secondary | ICD-10-CM | POA: Diagnosis not present

## 2023-04-22 DIAGNOSIS — F322 Major depressive disorder, single episode, severe without psychotic features: Secondary | ICD-10-CM

## 2023-04-22 DIAGNOSIS — F411 Generalized anxiety disorder: Secondary | ICD-10-CM | POA: Diagnosis not present

## 2023-04-22 MED ORDER — SERTRALINE HCL 25 MG PO TABS: 25.0000 mg | ORAL_TABLET | Freq: Every day | ORAL | 0 refills | Status: DC

## 2023-04-22 NOTE — Assessment & Plan Note (Signed)
Examination and history consistent with generalized anxiety with a component of major depression.  Patient has had success with Zoloft in the past.  She is willing to retry this today.  She is also willing to go through counseling through her work.  They provide around 6 sessions for her which she is very much willing to start. -Start Zoloft 25 mg daily -Follow-up in 2 weeks to 1 month -Counseling sessions to be initiated through [employer] employee counseling -Short-term disability paperwork filled out for 2 months, scanned into media and will place in fax folder

## 2023-04-22 NOTE — Patient Instructions (Addendum)
It was great to see you! Thank you for allowing me to participate in your care!   I recommend that you always bring your medications to each appointment as this makes it easy to ensure we are on the correct medications and helps [place] not miss when refills are needed.  Our plans for today:  - I would like you to start counseling through your job - I am sending in zoloft to take daily, follow up with me in 2 weeks to 1 month - I will work on your papers   Take care and seek immediate care sooner if you develop any concerns. Please remember to show up 15 minutes before your scheduled appointment time!  Levin Erp, MD [facility]

## 2023-04-29 ENCOUNTER — Ambulatory Visit: Payer: Managed Care, Other (non HMO) | Admitting: Student

## 2023-04-29 ENCOUNTER — Telehealth: Payer: Managed Care, Other (non HMO) | Admitting: Family Medicine

## 2023-04-30 ENCOUNTER — Ambulatory Visit (AMBULATORY_SURGERY_CENTER): Payer: Managed Care, Other (non HMO)

## 2023-04-30 ENCOUNTER — Encounter: Payer: Self-pay | Admitting: Internal Medicine

## 2023-04-30 VITALS — Ht 64.0 in | Wt 152.0 lb

## 2023-04-30 DIAGNOSIS — Z1211 Encounter for screening for malignant neoplasm of colon: Secondary | ICD-10-CM

## 2023-04-30 MED ORDER — NA SULFATE-K SULFATE-MG SULF 17.5-3.13-1.6 GM/177ML PO SOLN: 1.0000 | Freq: Once | ORAL | 0 refills | Status: AC

## 2023-04-30 NOTE — Progress Notes (Signed)
No egg or soy allergy known to patient  No issues known to pt with past sedation with any surgeries or procedures Patient denies ever being told they had issues or difficulty with intubation  No FH of Malignant Hyperthermia Pt is not on diet pills Pt is not on  home 02  Pt is not on blood thinners  Pt denies issues with constipation  No A fib or A flutter Have any cardiac testing pending--no  LOA: independent  Prep: suprep   Patient's chart reviewed by John Nulty CNRA prior to previsit and patient appropriate for the [facility].  Previsit completed and red dot placed by patient's name on their procedure day (on provider's schedule).     PV competed with patient. Prep instructions sent via mychart and home address. [facility] provided to use for price reduction if needed.   

## 2023-05-08 ENCOUNTER — Telehealth: Payer: Self-pay | Admitting: Family Medicine

## 2023-05-08 NOTE — Telephone Encounter (Signed)
Called patient and [facility] is [facility] to [facility] [place] [place] [facility] here to [facility] Rx. Pt is currently not out of medication, [facility].

## 2023-05-08 NOTE — Telephone Encounter (Signed)
Pt said [place] out of [place], will need temporally prescription for acetaZOLAMIDE (DIAMOX) 250 MG tablet sent to  [facility],  [address]  [place], [place] [address]  Phone: [phone] Pt said will be speaking with the pharmacy to transfer prescription.

## 2023-05-18 ENCOUNTER — Encounter: Payer: Self-pay | Admitting: Family Medicine

## 2023-05-21 NOTE — Progress Notes (Signed)
    SUBJECTIVE:   CHIEF COMPLAINT / HPI:   Disability paperwork Seen 04/22/2023 by PCP for mood stabilization in the setting of emotional stressor of husband filing for divorce suddenly. Started Zoloft 25mg  and counseling. Disability paperwork completed for 2 months   PERTINENT  PMH / PSH: ***  OBJECTIVE:   There were no vitals taken for this visit. ***  General: NAD, pleasant, able to participate in exam Cardiac: RRR, no murmurs. Respiratory: CTAB, normal effort, No wheezes, rales or rhonchi Abdomen: Bowel sounds present, nontender, nondistended Extremities: no edema or cyanosis. Skin: warm and dry, no rashes noted Neuro: alert, no obvious focal deficits Psych: Normal affect and mood  ASSESSMENT/PLAN:   No problem-specific Assessment & Plan notes found for this encounter.     Dr. Elberta Fortis, DO [facility]    {    This will disappear when note is signed, click to select method of visit    :1}

## 2023-05-22 ENCOUNTER — Ambulatory Visit: Payer: Managed Care, Other (non HMO) | Admitting: Family Medicine

## 2023-05-22 ENCOUNTER — Encounter: Payer: Self-pay | Admitting: Family Medicine

## 2023-05-22 ENCOUNTER — Other Ambulatory Visit: Payer: Self-pay

## 2023-05-22 VITALS — BP 109/98 | HR 80 | Ht 64.0 in | Wt 153.6 lb

## 2023-05-22 DIAGNOSIS — G932 Benign intracranial hypertension: Secondary | ICD-10-CM

## 2023-05-22 DIAGNOSIS — F411 Generalized anxiety disorder: Secondary | ICD-10-CM

## 2023-05-22 NOTE — Assessment & Plan Note (Addendum)
Improvement in symptoms but continues to have feels of psychomotor agitation and muscle tension. Stable on sertraline 25 mg, will not increase at this time as it seems patient is most improving from counseling. I do feel patient would most benefit from additional time to continue counseling to deal with her grief prior to returning to work. -Completed short-term disability paperwork (return on 07/07/2023) and provided patient with a copy in addition to a copy to fax with recent office notes -Continue counseling and sertraline 25 mg daily

## 2023-05-22 NOTE — Patient Instructions (Signed)
It was wonderful to see you today! Thank you for choosing [facility].   Please bring ALL of your medications with you to every visit.   Today we talked about:  I will fax the work document with my note from today from our office today or tomorrow. Please call our office if there is any issues. I am glad you are feeling a little better! Please continue to take the Sertraline and go to counseling during your leave. I know it will take some time to process everything you have been through.  Please follow up with PCP for health maintenance  Call the clinic at [phone] if your symptoms worsen or you have any concerns.  Please be sure to schedule follow up at the front desk before you leave today.   Elberta Fortis, DO Family Medicine

## 2023-05-22 NOTE — Assessment & Plan Note (Signed)
Headaches have resolved. Endorses muscle tension around scalp and neck, most likely secondary to stress. Continue current therapy

## 2023-05-26 ENCOUNTER — Encounter: Payer: Managed Care, Other (non HMO) | Admitting: Internal Medicine

## 2023-05-26 ENCOUNTER — Other Ambulatory Visit: Payer: Self-pay

## 2023-05-26 DIAGNOSIS — F411 Generalized anxiety disorder: Secondary | ICD-10-CM

## 2023-05-27 MED ORDER — SERTRALINE HCL 25 MG PO TABS: 25.0000 mg | ORAL_TABLET | Freq: Every day | ORAL | 0 refills | Status: DC

## 2023-05-30 ENCOUNTER — Telehealth: Payer: Self-pay

## 2023-05-30 NOTE — Telephone Encounter (Signed)
[employer] Case Manager for Short Term Disability calls nurse line in regards to STD paperwork.   She reports she reviewed the paperwork and office visit.   She reports she is needing additional information to support the continued LOA.  She reports it was noted the patient was improving and "stable" on medication. Therefore, she is needing additional clinical insight on why the patient can not return to work.   Will forward to [place].

## 2023-06-03 NOTE — Telephone Encounter (Signed)
Received returned call from [place]. Advised of message per Dr. Ardyth Harps.   No further questions at this time.   Veronda Prude, RN

## 2023-06-09 ENCOUNTER — Ambulatory Visit: Payer: Managed Care, Other (non HMO) | Admitting: Student

## 2023-07-01 LAB — HM COLONOSCOPY

## 2023-07-08 ENCOUNTER — Other Ambulatory Visit: Payer: Self-pay | Admitting: Student

## 2023-07-08 DIAGNOSIS — F411 Generalized anxiety disorder: Secondary | ICD-10-CM

## 2023-07-16 ENCOUNTER — Other Ambulatory Visit: Payer: Self-pay

## 2023-07-16 DIAGNOSIS — H4710 Unspecified papilledema: Secondary | ICD-10-CM

## 2023-07-16 DIAGNOSIS — G932 Benign intracranial hypertension: Secondary | ICD-10-CM

## 2023-07-16 MED ORDER — ACETAZOLAMIDE 250 MG PO TABS: 500.0000 mg | ORAL_TABLET | Freq: Two times a day (BID) | ORAL | 0 refills | Status: DC

## 2023-12-05 ENCOUNTER — Other Ambulatory Visit: Payer: Self-pay | Admitting: Family Medicine

## 2023-12-05 DIAGNOSIS — G932 Benign intracranial hypertension: Secondary | ICD-10-CM

## 2023-12-05 DIAGNOSIS — H4710 Unspecified papilledema: Secondary | ICD-10-CM

## 2024-03-30 ENCOUNTER — Encounter: Payer: Self-pay | Admitting: *Deleted

## 2024-04-29 ENCOUNTER — Telehealth: Payer: Self-pay | Admitting: Family Medicine

## 2024-04-29 NOTE — Telephone Encounter (Signed)
 Pt has scheduled her 1 yr f/u with Amy,NP

## 2024-04-30 ENCOUNTER — Telehealth: Payer: Self-pay | Admitting: Family Medicine

## 2024-04-30 NOTE — Telephone Encounter (Signed)
 Pt is asking for a call to discuss a 4 day headache she is having which is very concerning for her.  Pt states there has been on med changes, she would like a MRI scheduled for Aug 15th, please call pt to discuss this request, pt states she does not feel up to sending this request as a my chart message when phone rep made the suggestion.

## 2024-04-30 NOTE — Telephone Encounter (Addendum)
 Called pt at [phone]. Last seen 02/2023.Advised she needs to be seen once a year for continued treatment. Offered appt with Amy 05/03/24 at 8 or 10am but she declined as she could not get work off. Offered 05/17/24 at 1pm, she declined, wanted latest appt in afternoon. Asked for appt in August/Sep. Aware nothing available currently with Amy. She has not seen MD yet (saw Elveria Lunger, NP and Dr. Jerri in past who are no longer at office). Scheduled with Dr. Vear 05/26/24 at 8:30am.  She will proceed to urgent care or call PCP for immediate evaluation/treatment until seen for updated visit at our office.

## 2024-05-03 ENCOUNTER — Other Ambulatory Visit: Payer: Self-pay | Admitting: Family Medicine

## 2024-05-03 DIAGNOSIS — G932 Benign intracranial hypertension: Secondary | ICD-10-CM

## 2024-05-03 DIAGNOSIS — R519 Headache, unspecified: Secondary | ICD-10-CM

## 2024-05-26 ENCOUNTER — Ambulatory Visit: Admitting: Neurology

## 2024-06-01 ENCOUNTER — Telehealth: Admitting: Family Medicine

## 2024-06-04 ENCOUNTER — Encounter: Payer: Self-pay | Admitting: Family Medicine

## 2024-06-04 ENCOUNTER — Ambulatory Visit
Admission: RE | Admit: 2024-06-04 | Discharge: 2024-06-04 | Disposition: A | Discharge status: Home / Self Care | Source: Ambulatory Visit | Attending: Family Medicine | Admitting: Family Medicine

## 2024-06-04 ENCOUNTER — Ambulatory Visit (INDEPENDENT_AMBULATORY_CARE_PROVIDER_SITE_OTHER): Admitting: Family Medicine

## 2024-06-04 ENCOUNTER — Other Ambulatory Visit (HOSPITAL_COMMUNITY)
Admission: RE | Admit: 2024-06-04 | Discharge: 2024-06-04 | Disposition: A | Discharge status: Home / Self Care | Source: Ambulatory Visit | Attending: Family Medicine | Admitting: Family Medicine

## 2024-06-04 VITALS — BP 120/75 | HR 69 | Ht 64.0 in | Wt 161.4 lb

## 2024-06-04 DIAGNOSIS — E785 Hyperlipidemia, unspecified: Secondary | ICD-10-CM | POA: Diagnosis not present

## 2024-06-04 DIAGNOSIS — Z01419 Encounter for gynecological examination (general) (routine) without abnormal findings: Secondary | ICD-10-CM

## 2024-06-04 DIAGNOSIS — G932 Benign intracranial hypertension: Secondary | ICD-10-CM

## 2024-06-04 DIAGNOSIS — Z124 Encounter for screening for malignant neoplasm of cervix: Secondary | ICD-10-CM

## 2024-06-04 DIAGNOSIS — R519 Headache, unspecified: Secondary | ICD-10-CM

## 2024-06-04 DIAGNOSIS — Z1231 Encounter for screening mammogram for malignant neoplasm of breast: Secondary | ICD-10-CM

## 2024-06-04 MED ORDER — PRENATAL VITAMINS 27-0.8 MG PO TABS: ORAL_TABLET | ORAL | Status: AC

## 2024-06-04 MED ORDER — GADOPICLENOL 0.5 MMOL/ML IV SOLN
7.0000 mL | Freq: Once | INTRAVENOUS | Status: AC | PRN
  Administered 2024-06-04: 7 mL via INTRAVENOUS

## 2024-06-04 NOTE — Assessment & Plan Note (Signed)
 No hep B screening on file.  Added to future order, patient to schedule lab visit when able. - Hepatitis B surface antigen; Future

## 2024-06-04 NOTE — Progress Notes (Signed)
    SUBJECTIVE:   Chief compliant/HPI: annual examination  Joy Gentry is a 53 y.o. who presents today for an annual exam.    History tabs reviewed and updated.   Review of systems form reviewed and notable for none.   Having repeat brain MRI done after experiencing some headaches last month.  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  OBJECTIVE:   BP 120/75   Pulse 69   Ht 5' 4 (1.626 m)   Wt 161 lb 6.4 oz (73.2 kg)   SpO2 100%   BMI 27.70 kg/m   Gen: awake and conversant, in NAD CV: RRR, normal S1/S2, no m/r/g Resp: Normal WOB on RA, CTAB GU (chaperone, Dayshia Ottley, CMA present): normal external female genitalia, normal vaginal rugae, scant thick white discharge at cervical os. No lesions visualized Psych: appropriate mood and affect Neuro: no focal deficits  ASSESSMENT/PLAN:   Assessment & Plan Hyperlipidemia, unspecified hyperlipidemia type Elevated TC and LDL on last check in 2023.  Has never taken medicines for this.  No phlebotomist available in clinic today, pt to schedule lab appt to have this collected. - Lipid Panel; Future Encounter for screening mammogram for malignant neoplasm of breast Pt due for mammogram - MM 3D SCREENING MAMMOGRAM BILATERAL BREAST; Future Cervical cancer screening Pap done today. Hx of ASCUS, negative high risk HPV. Will f/u with results. Well woman exam No hep B screening on file.  Added to future order, patient to schedule lab visit when able. - Hepatitis B surface antigen; Future    Annual Examination  See AVS for age appropriate recommendations  PHQ score 1, reviewed and discussed.  BP reviewed and at goal.  Asked about intimate partner violence and resources given as appropriate  Advance directives discussion not done  Considered the following items based upon USPSTF recommendations: Diabetes screening: last A1c 5.1 last year Screening for elevated cholesterol:  ordered HIV testing: discussed pt declined STI testing today Hepatitis C: neg in 2022 Hepatitis B: ordered Syphilis if at high risk: discussed GC/CT not at high risk and not ordered. Osteoporosis screening considered. DEXA not ordered.  Reviewed risk factors for latent tuberculosis and not indicated   Discussed family history, BRCA testing not ordered. No Fhx breast cancer Cervical cancer screening: due for Pap today, cytology + HPV ordered Breast cancer screening: discussed potential benefits, risks including overdiagnosis and biopsy, elected proceed with mammogram Colorectal cancer screening: up to date on screening for CRC.  Completed in 2024 at outside facility, had 1-2 polyps (pathology report in chart states tubular adenoma with adjacent prominent lymphoid aggregate) removed.  Patient says she was told to have another colonoscopy in 5 years.  Patient to request records. Lung cancer screening: never smoker. See documentation below regarding indications/risks/benefits.  Vaccinations recommended shingles vaccine.   Follow up in 1 year or sooner if indicated.  MyChart Activation: Already signed up  Rea Raring, MD [facility]

## 2024-06-04 NOTE — Patient Instructions (Addendum)
 Thank you for coming in today! Here is a summary of what we discussed:  -Please schedule a lab visit when you're able to  -You can get the Shingrix vaccine at the pharmacy. You will need a 2nd shot 2-6 months after the first. This vaccine is important to help prevent shingles.    -Please try to get the records from your colonoscopy so we can have them on file  -You need a mammogram to prevent breast cancer.  Please schedule an appointment.  You can call [phone].      We are checking some labs today. If they are abnormal, I will call you. If they are normal, I will send you a MyChart message (if it is active) or a letter in the mail. If you do not hear about your labs in the next 2 weeks, please call the office.     Please call the clinic at [phone] if your symptoms worsen or you have any concerns.  Best, Dr Adele

## 2024-06-04 NOTE — Assessment & Plan Note (Addendum)
 Elevated TC and LDL on last check in 2023.  Has never taken medicines for this.  No phlebotomist available in clinic today, pt to schedule lab appt to have this collected. - Lipid Panel; Future

## 2024-06-09 ENCOUNTER — Ambulatory Visit: Payer: Self-pay | Admitting: Family Medicine

## 2024-06-09 LAB — CYTOLOGY - PAP
Comment: NEGATIVE
Diagnosis: NEGATIVE
High risk HPV: NEGATIVE

## 2024-06-28 ENCOUNTER — Other Ambulatory Visit

## 2024-06-28 DIAGNOSIS — E785 Hyperlipidemia, unspecified: Secondary | ICD-10-CM

## 2024-06-28 DIAGNOSIS — Z01419 Encounter for gynecological examination (general) (routine) without abnormal findings: Secondary | ICD-10-CM

## 2024-06-29 LAB — LIPID PANEL
Chol/HDL Ratio: 2.8 ratio (ref 0.0–4.4)
Cholesterol, Total: 209 mg/dL — ABNORMAL HIGH (ref 100–199)
HDL: 75 mg/dL (ref >39)
LDL Chol Calc (NIH): 126 mg/dL — ABNORMAL HIGH (ref 0–99)
Triglycerides: 44 mg/dL (ref 0–149)
VLDL Cholesterol Cal: 8 mg/dL (ref 5–40)

## 2024-06-29 LAB — HEPATITIS B SURFACE ANTIGEN: Hepatitis B Surface Ag: NEGATIVE

## 2024-07-01 NOTE — Progress Notes (Unsigned)
 PATIENT: Joy Gentry DOB: 1970-10-25  REASON FOR VISIT: follow up HISTORY FROM: patient  No chief complaint on file.    HISTORY OF PRESENT ILLNESS:  07/01/24 ALL:   03/06/23 ALL (Mychart): Joy Gentry returns for follow up for IIH. She was last seen 02/2022 and doing well. We continued acetazolamide  250mg  in am and 500mg  in pm as headaches were well managed and discussed continued wean in the future. She attempted to reduce dose to 250mg  BID but became concerned about more tightness in shoulders so resumed 250/500. She called 01/2023 stating that headaches remained well managed, however, she was requesting disability due to increased stress at work. She was advised to speak with employer. She called back 02/2023 requesting to discuss FMLA.   Today, she reports doing fairly well. She denies headaches. No vision changes. She does have a couple of times a month where she feels more pressure in the back of her head. Also notes shoulder tension and feels that it is harder to concentrate. 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 She is able to use health and safety time. Eye exam is coming up.   02/26/2022 ALL (Mychart): Joy Gentry is a 53 y.o. female here today for follow up for IIH. She continues acetazolamide  500mg  BID. She is doing well. She is not having any headaches. No vision changes. She is scheduled for an eye exam next month. She is trying to do better with taking medication consistently. She does well until she is due for a refill. Sometimes it is 1-2 weeks before she gets a refill. She prefers 30 day refills.   01/10/2021 ALL:  She returns for follow up for IIH. Last seen 08/2019 and advised to increase Diamox  to 500mg  BID due to continued papilledema on ophthalmology exam in 07/2019. She had increased dose but had a period of time where she could not afford her medication so she decreased dose to make it last. She has been back on  500mg  twice daily for the past 2-3 months. Eye exam was stable with no significant changes but no improvement.   09/13/2019 ALL:  Joy Gentry is a 53 y.o. female here today for follow up of IIH. Diamox  was increased to 500mg  twice daily following report from ophthalmology in 07/2019 concerning for continued papilledema. She increased dose to 250mg  in am and 500mg  in the evenings. She was concerned about taking 500mg  twice daily. She wanted to talk to us  before increasing this dose.  She denies any side effects.  She reports that headaches have completely resolved.  No visual changes or concerns.  She is uncertain of ophthalmology's recommendation for follow-up.  HISTORY: (copied from [employer] note on 04/29/2018)  Joy Gentry is a 53 y.o. 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    She stated that for the last several years she started to feel pressure-like feeling in the back of her head and upper neck, this feeling comes and goes, happens every week lasting hours. For the last 1 year also she started to have episodic vision changes, especially in the morning, with vision suddenly goes greyish for several seconds. It happens once or twice a months. She denies any frank headache, patient loss, nausea vomiting, loss of consciousness. She admitted that she normally had photophobia and phonophobia, which has been for her long time.  She went to see her ophthalmologist Dr. 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   She stated that about 27 years ago she had headache, mild right arm weakness, and mild problem with coordination. 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  However she cannot remember what the medical term of that condition. She stated that after surgery or her symptoms were gone.   She denies any history of migraine, hypertension, diabetes, hyperlipidemia. She stated that she gained 10 pounds this year, and now she is 170 pounds. She also admit that she also bathroom a lot.   She denies smoking, drinking or illicit drugs. She works in [employer], stressful job, she also experiences recent years of anxiety due to being single mom, insurance problems among others. 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   02/05/17 follow up - pt had LP on 06/13/15 and OP was 20cm H2O and CP was 3cm H2O. CSF unremarkable except WBC 11. She is asymptomatic clinically, no concern for CNS infection. CSF culture negative. She was put on diamox  and titrated up to 500mg  bid. Complaint of drowsiness during the day, concerning for side effect from Diamox  and put her on 500 mg Diamox  at night and decreased morning dose of Diamox  from 500 to 250. Tolerating well. She moved to [place], [place] in 09/2015, but did not follow neurology there. She followed with ophthalmology and continued to see improvement of her fundi exam and recommended to stop diamox  due to concern of kidney function with long term use of diamox . However, she does not feel comfortable to be off diamox  completely at that time. After discussion, we decreased her dose to 250mg . In 04/2016, she lost her insurance so her diamox  was stopped by her own. Although she was asymptomatic, recent eye exam on 01/28/17 showed bilateral disc margin indistinct, and Dr. Vicci mentioned swelling is noticeable, and recommend to re-institute therapy. She denies any HA, vision disturbance. She has new prescription glass to be ready next week.    Interval history:11/08/17 Dr.  Jerri During the interval time, pt has been doing well. No visual disturbance, no HA. She still on diamox  250mg  bid. She followed up with Dr. Darcie on 09/19/17 and check fundi showed bilateral disc margin is indistinct, disc size is large, there are breaks in the nerve fiber layer that do not extend back to the optic nerve margin. But no papilledema mentioned.    UPDATE 7/10/2019CM Joy Gentry, 53 year old female returns for follow-up with history of papilledema.  She is currently on Diamox  250 twice daily.  She had eye exam today at Dr. Ferdie.  Visual fields unchanged and stable.  She will follow-up yearly for visual field testing.  She denies any headache or visual disturbance.  She is due for routine labs through primary care.  She returns for reevaluation     REVIEW OF SYSTEMS: Out of a complete 14 system review of symptoms, the patient complains only of the following symptoms, frequency of urination and all other reviewed systems are negative.  ALLERGIES: No Known Allergies  HOME MEDICATIONS: Outpatient Medications Prior to Visit  Medication Sig Dispense Refill  acetaZOLAMIDE  (DIAMOX ) 250 MG tablet TAKE 2 TABLETS BY MOUTH 2 TIMES DAILY. 360 tablet 1   Prenatal Vit-Fe Fumarate-FA (PRENATAL VITAMINS) 27-0.8 MG TABS 1 tablet daily     vitamin B-12 (CYANOCOBALAMIN) 100 MCG tablet Take 100 mcg by mouth daily.     No facility-administered medications prior to visit.    PAST MEDICAL HISTORY: Past Medical History:  Diagnosis Date   Vision abnormalities     Papilledema    PAST SURGICAL HISTORY: Past Surgical History:  Procedure Laterality Date   CRANIECTOMY     Sub-occipital decompressive craniectomy for chiari malformation   neck sugery     oophrectomy Left     FAMILY HISTORY: Family History  Problem Relation Age of Onset   Hypertension Mother    Hypertension Father    Colon cancer Neg Hx    Colon polyps Neg Hx    Esophageal cancer Neg Hx    Rectal cancer Neg Hx     Stomach cancer Neg Hx     SOCIAL HISTORY: Social History   Socioeconomic History   Marital status: Legally Separated    Spouse name: Not on file   Number of children: Not on file   Years of education: Not on file   Highest education level: 12th grade  Occupational History   Not on file  Tobacco Use   Smoking status: Never   Smokeless tobacco: Never  Vaping Use   Vaping status: Never Used  Substance and Sexual Activity   Alcohol use: No   Drug use: No   Sexual activity: Yes  Other Topics Concern   Not on file  Social History Narrative   Not on file   Social Drivers of Health   Financial Resource Strain: Low Risk  (06/04/2024)   Overall Financial Resource Strain (CARDIA)    Difficulty of Paying Living Expenses: Not hard at all  Food Insecurity: No Food Insecurity (06/04/2024)   Hunger Vital Sign    Worried About Running Out of Food in the Last Year: Never true    Ran Out of Food in the Last Year: Never true  Transportation Needs: No Transportation Needs (06/04/2024)   PRAPARE - Administrator, Civil Service (Medical): No    Lack of Transportation (Non-Medical): No  Physical Activity: Insufficiently Active (06/04/2024)   Exercise Vital Sign    Days of Exercise per Week: 2 days    Minutes of Exercise per Session: 70 min  Stress: No Stress Concern Present (06/04/2024)   [employer] of Occupational Health - Occupational Stress Questionnaire    Feeling of Stress: Not at all  Social Connections: Socially Integrated (06/04/2024)   Social Connection and Isolation Panel    Frequency of Communication with Friends and Family: Three times a week    Frequency of Social Gatherings with Friends and Family: Not on file    Attends Religious Services: More than 4 times per year    Active Member of [employer] or Organizations: Yes    Attends Engineer, structural: More than 4 times per year    Marital Status: Married  Catering manager Violence: Not on file       PHYSICAL EXAM  There were no vitals filed for this visit.  There is no height or weight on file to calculate BMI.  Generalized: Well developed, in no acute distress  Cardiology: normal rate and rhythm, no murmur noted Neurological examination  Mentation: Alert oriented to time, place, history taking. Follows all commands speech and  language fluent Cranial nerve II-XII: Pupils were equal round reactive to light. Extraocular movements were full, visual field were full on confrontational test. Unable to visualize optic disc due to small pupils  Motor: The motor testing reveals 5 over 5 strength of all 4 extremities. Good symmetric motor tone is noted throughout.  Sensory: Sensory testing is intact to soft touch on all 4 extremities. No evidence of extinction is noted.  Coordination: Cerebellar testing reveals good finger-nose-finger and heel-to-shin bilaterally.  Gait and station: Gait is normal.   DIAGNOSTIC DATA (LABS, IMAGING, TESTING) - I reviewed patient records, labs, notes, testing and imaging myself where available.      No data to display           Lab Results  Component Value Date   WBC 4.5 07/05/2022   HGB 12.5 07/05/2022   HCT 37.5 07/05/2022   MCV 92 07/05/2022   PLT 270 07/05/2022      Component Value Date/Time   NA 141 07/05/2022 1656   K 3.8 07/05/2022 1656   CL 105 07/05/2022 1656   CO2 22 07/05/2022 1656   GLUCOSE 85 07/05/2022 1656   GLUCOSE 70 05/29/2015 1513   BUN 13 07/05/2022 1656   CREATININE 0.83 07/05/2022 1656   CREATININE 0.80 05/29/2015 1513   CALCIUM 9.4 07/05/2022 1656   PROT 7.3 07/05/2022 1656   ALBUMIN 4.6 07/05/2022 1656   AST 19 07/05/2022 1656   ALT 19 07/05/2022 1656   ALKPHOS 67 07/05/2022 1656   BILITOT 0.7 07/05/2022 1656   GFRNONAA 75 12/31/2019 1119   GFRAA 87 12/31/2019 1119   Lab Results  Component Value Date   CHOL 209 (H) 06/28/2024   HDL 75 06/28/2024   LDLCALC 126 (H) 06/28/2024   TRIG 44 06/28/2024    CHOLHDL 2.8 06/28/2024   Lab Results  Component Value Date   HGBA1C 5.1 07/05/2022   No results found for: [id number] Lab Results  Component Value Date   TSH 2.610 07/05/2022     ASSESSMENT AND PLAN 53 y.o. year old female  has a past medical history of Vision abnormalities. here with   No diagnosis found.    Mrs. Carlo continues to tolerate Diamox  well. She will continue Diamox  500mg  twice daily.  I have advised regular follow-up with ophthalmology.  Healthy lifestyle habits encouraged. She will call with any new or worsening symptoms. She will follow up in 1 year. She verbalizes understanding and agreement with this plan.   No orders of the defined types were placed in this encounter.    No orders of the defined types were placed in this encounter.     I spent 20 minutes with the patient. 50% of this time was spent counseling and educating patient on plan of care and medications.     Greig Forbes, FNP-C 07/01/2024, 11:45 AM [facility] [address], [address] [place], [place] [address] [phone]

## 2024-07-02 ENCOUNTER — Telehealth: Payer: Self-pay | Admitting: Family Medicine

## 2024-07-02 ENCOUNTER — Encounter: Payer: Self-pay | Admitting: Family Medicine

## 2024-07-02 ENCOUNTER — Ambulatory Visit (INDEPENDENT_AMBULATORY_CARE_PROVIDER_SITE_OTHER): Admitting: Family Medicine

## 2024-07-02 VITALS — BP 118/68 | HR 74 | Ht 64.0 in | Wt 163.0 lb

## 2024-07-02 DIAGNOSIS — G932 Benign intracranial hypertension: Secondary | ICD-10-CM

## 2024-07-02 DIAGNOSIS — H4710 Unspecified papilledema: Secondary | ICD-10-CM | POA: Diagnosis not present

## 2024-07-02 MED ORDER — ACETAZOLAMIDE 250 MG PO TABS: 500.0000 mg | ORAL_TABLET | Freq: Two times a day (BID) | ORAL | 3 refills | Status: AC

## 2024-07-02 NOTE — Patient Instructions (Signed)
 Below is our plan:  We will continue acetazolamide  250mg  in the morning and 500mg  at bedtime. If headaches worsen, increase morning dose to 500mg . Continue close follow up with your ophthalmologist.   Please make sure you are staying well hydrated. I recommend 50-60 ounces daily. Well balanced diet and regular exercise encouraged. Consistent sleep schedule with 6-8 hours recommended.   Please continue follow up with care team as directed.   Follow up with me in 1 year   You may receive a survey regarding today's visit. I encourage you to leave honest feed back as I do use this information to improve patient care. Thank you for seeing me today!

## 2024-07-02 NOTE — Telephone Encounter (Signed)
 Pt asked that it be noted that the results to her MRI (ordered by another office) are in My chart

## 2024-07-03 LAB — COMPREHENSIVE METABOLIC PANEL WITH GFR
ALT: 19 IU/L (ref 0–32)
AST: 23 IU/L (ref 0–40)
Albumin: 4.2 g/dL (ref 3.8–4.9)
Alkaline Phosphatase: 55 IU/L (ref 44–121)
BUN/Creatinine Ratio: 14 (ref 9–23)
BUN: 12 mg/dL (ref 6–24)
Bilirubin Total: 0.6 mg/dL (ref 0.0–1.2)
CO2: 26 mmol/L (ref 20–29)
Calcium: 9.4 mg/dL (ref 8.7–10.2)
Chloride: 105 mmol/L (ref 96–106)
Creatinine, Ser: 0.84 mg/dL (ref 0.57–1.00)
Globulin, Total: 2.6 g/dL (ref 1.5–4.5)
Glucose: 86 mg/dL (ref 70–99)
Potassium: 4.5 mmol/L (ref 3.5–5.2)
Sodium: 144 mmol/L (ref 134–144)
Total Protein: 6.8 g/dL (ref 6.0–8.5)
eGFR: 83 mL/min/1.73 (ref >59)

## 2024-07-05 ENCOUNTER — Ambulatory Visit: Payer: Self-pay | Admitting: Family Medicine

## 2024-07-21 ENCOUNTER — Encounter: Payer: Self-pay | Admitting: Family Medicine

## 2024-08-06 ENCOUNTER — Ambulatory Visit
Admission: RE | Admit: 2024-08-06 | Discharge: 2024-08-06 | Disposition: A | Source: Ambulatory Visit | Attending: Family Medicine

## 2024-08-06 DIAGNOSIS — Z1231 Encounter for screening mammogram for malignant neoplasm of breast: Secondary | ICD-10-CM

## 2024-08-23 ENCOUNTER — Telehealth: Payer: Self-pay | Admitting: Family Medicine

## 2024-08-23 NOTE — Telephone Encounter (Signed)
 Pt called to request letter to be faxed over to another doctor about her diagnoses . Pt need to get bill down or a PRE existing issue . Pt didn't  go into detailed about issue , would like  to discuss with Nurse If possible what letter is for

## 2024-09-30 ENCOUNTER — Encounter: Payer: Self-pay | Admitting: Family Medicine

## 2025-07-04 ENCOUNTER — Ambulatory Visit: Admitting: Family Medicine
